# Patient Record
Sex: Female | Born: 1960 | Race: White | Hispanic: No | Smoking: Former smoker
Health system: Southern US, Community
[De-identification: ages and names within clinical notes are randomized; demographics above are authoritative.]

## PROBLEM LIST (undated history)

## (undated) DIAGNOSIS — I219 Acute myocardial infarction, unspecified: Secondary | ICD-10-CM

## (undated) DIAGNOSIS — J449 Chronic obstructive pulmonary disease, unspecified: Secondary | ICD-10-CM

## (undated) DIAGNOSIS — I10 Essential (primary) hypertension: Secondary | ICD-10-CM

## (undated) DIAGNOSIS — I2510 Atherosclerotic heart disease of native coronary artery without angina pectoris: Secondary | ICD-10-CM

## (undated) HISTORY — PX: APPENDECTOMY: SHX54

## (undated) HISTORY — PX: TUBAL LIGATION: SHX77

---

## 2007-03-17 ENCOUNTER — Other Ambulatory Visit: Payer: Self-pay

## 2007-03-17 ENCOUNTER — Inpatient Hospital Stay: Payer: Self-pay | Admitting: Internal Medicine

## 2010-12-20 ENCOUNTER — Observation Stay: Payer: Self-pay | Admitting: Internal Medicine

## 2011-01-25 ENCOUNTER — Ambulatory Visit: Payer: Self-pay

## 2012-08-02 DIAGNOSIS — J449 Chronic obstructive pulmonary disease, unspecified: Secondary | ICD-10-CM | POA: Insufficient documentation

## 2012-08-02 DIAGNOSIS — I2510 Atherosclerotic heart disease of native coronary artery without angina pectoris: Secondary | ICD-10-CM | POA: Insufficient documentation

## 2013-03-17 DIAGNOSIS — F338 Other recurrent depressive disorders: Secondary | ICD-10-CM | POA: Insufficient documentation

## 2013-03-17 DIAGNOSIS — E785 Hyperlipidemia, unspecified: Secondary | ICD-10-CM | POA: Insufficient documentation

## 2014-12-06 DIAGNOSIS — M79604 Pain in right leg: Secondary | ICD-10-CM | POA: Insufficient documentation

## 2014-12-29 DIAGNOSIS — G4700 Insomnia, unspecified: Secondary | ICD-10-CM | POA: Insufficient documentation

## 2014-12-29 DIAGNOSIS — F32A Depression, unspecified: Secondary | ICD-10-CM | POA: Insufficient documentation

## 2015-01-10 DIAGNOSIS — G2581 Restless legs syndrome: Secondary | ICD-10-CM | POA: Insufficient documentation

## 2015-01-10 DIAGNOSIS — G4739 Other sleep apnea: Secondary | ICD-10-CM | POA: Insufficient documentation

## 2015-02-25 DIAGNOSIS — N951 Menopausal and female climacteric states: Secondary | ICD-10-CM | POA: Insufficient documentation

## 2017-07-25 DIAGNOSIS — Z0000 Encounter for general adult medical examination without abnormal findings: Secondary | ICD-10-CM | POA: Insufficient documentation

## 2017-07-25 DIAGNOSIS — G629 Polyneuropathy, unspecified: Secondary | ICD-10-CM | POA: Insufficient documentation

## 2017-07-25 DIAGNOSIS — R5383 Other fatigue: Secondary | ICD-10-CM | POA: Insufficient documentation

## 2018-03-21 DIAGNOSIS — L989 Disorder of the skin and subcutaneous tissue, unspecified: Secondary | ICD-10-CM | POA: Insufficient documentation

## 2018-08-26 DIAGNOSIS — E538 Deficiency of other specified B group vitamins: Secondary | ICD-10-CM | POA: Insufficient documentation

## 2018-08-31 ENCOUNTER — Inpatient Hospital Stay
Admission: EM | Admit: 2018-08-31 | Discharge: 2018-09-02 | DRG: 419 | Disposition: A | Discharge status: Home / Self Care | Payer: Medicaid Other | Attending: Internal Medicine | Admitting: Internal Medicine

## 2018-08-31 ENCOUNTER — Inpatient Hospital Stay: Payer: Medicaid Other

## 2018-08-31 ENCOUNTER — Emergency Department: Payer: Medicaid Other

## 2018-08-31 ENCOUNTER — Other Ambulatory Visit: Payer: Self-pay

## 2018-08-31 ENCOUNTER — Encounter: Payer: Self-pay | Admitting: Emergency Medicine

## 2018-08-31 DIAGNOSIS — J449 Chronic obstructive pulmonary disease, unspecified: Secondary | ICD-10-CM | POA: Diagnosis present

## 2018-08-31 DIAGNOSIS — Z972 Presence of dental prosthetic device (complete) (partial): Secondary | ICD-10-CM | POA: Diagnosis not present

## 2018-08-31 DIAGNOSIS — I252 Old myocardial infarction: Secondary | ICD-10-CM

## 2018-08-31 DIAGNOSIS — R1013 Epigastric pain: Secondary | ICD-10-CM | POA: Diagnosis present

## 2018-08-31 DIAGNOSIS — F329 Major depressive disorder, single episode, unspecified: Secondary | ICD-10-CM | POA: Diagnosis present

## 2018-08-31 DIAGNOSIS — K760 Fatty (change of) liver, not elsewhere classified: Secondary | ICD-10-CM | POA: Diagnosis present

## 2018-08-31 DIAGNOSIS — F419 Anxiety disorder, unspecified: Secondary | ICD-10-CM | POA: Diagnosis present

## 2018-08-31 DIAGNOSIS — R109 Unspecified abdominal pain: Secondary | ICD-10-CM | POA: Diagnosis present

## 2018-08-31 DIAGNOSIS — I10 Essential (primary) hypertension: Secondary | ICD-10-CM | POA: Diagnosis present

## 2018-08-31 DIAGNOSIS — F17210 Nicotine dependence, cigarettes, uncomplicated: Secondary | ICD-10-CM | POA: Diagnosis present

## 2018-08-31 DIAGNOSIS — Z716 Tobacco abuse counseling: Secondary | ICD-10-CM

## 2018-08-31 DIAGNOSIS — R7989 Other specified abnormal findings of blood chemistry: Secondary | ICD-10-CM | POA: Diagnosis present

## 2018-08-31 DIAGNOSIS — K8010 Calculus of gallbladder with chronic cholecystitis without obstruction: Principal | ICD-10-CM | POA: Diagnosis present

## 2018-08-31 DIAGNOSIS — R933 Abnormal findings on diagnostic imaging of other parts of digestive tract: Secondary | ICD-10-CM

## 2018-08-31 DIAGNOSIS — Z79899 Other long term (current) drug therapy: Secondary | ICD-10-CM | POA: Diagnosis not present

## 2018-08-31 DIAGNOSIS — Z885 Allergy status to narcotic agent status: Secondary | ICD-10-CM

## 2018-08-31 DIAGNOSIS — R945 Abnormal results of liver function studies: Secondary | ICD-10-CM

## 2018-08-31 DIAGNOSIS — I2510 Atherosclerotic heart disease of native coronary artery without angina pectoris: Secondary | ICD-10-CM | POA: Diagnosis present

## 2018-08-31 HISTORY — DX: Atherosclerotic heart disease of native coronary artery without angina pectoris: I25.10

## 2018-08-31 HISTORY — DX: Essential (primary) hypertension: I10

## 2018-08-31 HISTORY — DX: Acute myocardial infarction, unspecified: I21.9

## 2018-08-31 LAB — URINALYSIS, COMPLETE (UACMP) WITH MICROSCOPIC
Bilirubin Urine: NEGATIVE
Glucose, UA: NEGATIVE mg/dL
Hgb urine dipstick: NEGATIVE
Ketones, ur: NEGATIVE mg/dL
Leukocytes, UA: NEGATIVE
Nitrite: NEGATIVE
PROTEIN: 30 mg/dL — AB
Specific Gravity, Urine: 1.023 (ref 1.005–1.030)
pH: 5 (ref 5.0–8.0)

## 2018-08-31 LAB — COMPREHENSIVE METABOLIC PANEL
ALBUMIN: 4.2 g/dL (ref 3.5–5.0)
ALT: 672 U/L — AB (ref 0–44)
AST: 444 U/L — ABNORMAL HIGH (ref 15–41)
Alkaline Phosphatase: 403 U/L — ABNORMAL HIGH (ref 38–126)
Anion gap: 12 (ref 5–15)
BUN: 9 mg/dL (ref 6–20)
CO2: 21 mmol/L — ABNORMAL LOW (ref 22–32)
Calcium: 9.1 mg/dL (ref 8.9–10.3)
Chloride: 104 mmol/L (ref 98–111)
Creatinine, Ser: 0.75 mg/dL (ref 0.44–1.00)
GFR calc Af Amer: >60 mL/min (ref >60)
GFR calc non Af Amer: >60 mL/min (ref >60)
GLUCOSE: 153 mg/dL — AB (ref 70–99)
Potassium: 3.6 mmol/L (ref 3.5–5.1)
Sodium: 137 mmol/L (ref 135–145)
Total Bilirubin: 2.4 mg/dL — ABNORMAL HIGH (ref 0.3–1.2)
Total Protein: 8.2 g/dL — ABNORMAL HIGH (ref 6.5–8.1)

## 2018-08-31 LAB — CBC
HCT: 44.7 % (ref 36.0–46.0)
Hemoglobin: 14.7 g/dL (ref 12.0–15.0)
MCH: 33.3 pg (ref 26.0–34.0)
MCHC: 32.9 g/dL (ref 30.0–36.0)
MCV: 101.4 fL — ABNORMAL HIGH (ref 80.0–100.0)
Platelets: 275 10*3/uL (ref 150–400)
RBC: 4.41 MIL/uL (ref 3.87–5.11)
RDW: 13.6 % (ref 11.5–15.5)
WBC: 9.2 10*3/uL (ref 4.0–10.5)
nRBC: 0 % (ref 0.0–0.2)

## 2018-08-31 LAB — BILIRUBIN, DIRECT: Bilirubin, Direct: 1.4 mg/dL — ABNORMAL HIGH (ref 0.0–0.2)

## 2018-08-31 LAB — LIPASE, BLOOD: Lipase: 33 U/L (ref 11–51)

## 2018-08-31 LAB — LACTATE DEHYDROGENASE: LDH: 477 U/L — ABNORMAL HIGH (ref 98–192)

## 2018-08-31 LAB — POCT PREGNANCY, URINE: Preg Test, Ur: NEGATIVE

## 2018-08-31 MED ORDER — SODIUM CHLORIDE 0.45 % IV SOLN
INTRAVENOUS | Status: DC
  Administered 2018-08-31 – 2018-09-02 (×3): via INTRAVENOUS

## 2018-08-31 MED ORDER — VITAMIN B-12 1000 MCG PO TABS
1000.0000 ug | ORAL_TABLET | Freq: Every day | ORAL | Status: DC
  Administered 2018-09-01 – 2018-09-02 (×2): 1000 ug via ORAL
  Filled 2018-08-31 (×2): qty 1

## 2018-08-31 MED ORDER — MORPHINE SULFATE (PF) 4 MG/ML IV SOLN
4.0000 mg | Freq: Once | INTRAVENOUS | Status: AC
  Administered 2018-08-31: 4 mg via INTRAVENOUS
  Filled 2018-08-31: qty 1

## 2018-08-31 MED ORDER — OMEGA-3-ACID ETHYL ESTERS 1 G PO CAPS
1000.0000 mg | ORAL_CAPSULE | Freq: Two times a day (BID) | ORAL | Status: DC
  Administered 2018-08-31 – 2018-09-02 (×3): 1000 mg via ORAL
  Filled 2018-08-31 (×3): qty 1

## 2018-08-31 MED ORDER — ONDANSETRON HCL 4 MG/2ML IJ SOLN
4.0000 mg | Freq: Once | INTRAMUSCULAR | Status: AC
  Administered 2018-08-31: 4 mg via INTRAVENOUS
  Filled 2018-08-31: qty 2

## 2018-08-31 MED ORDER — CEFAZOLIN (ANCEF) 1 G IV SOLR: 1.0000 g | INTRAVENOUS | Status: DC

## 2018-08-31 MED ORDER — MORPHINE SULFATE (PF) 2 MG/ML IV SOLN
2.0000 mg | INTRAVENOUS | Status: DC | PRN
  Administered 2018-09-01 – 2018-09-02 (×3): 2 mg via INTRAVENOUS
  Filled 2018-08-31 (×3): qty 1

## 2018-08-31 MED ORDER — TRAZODONE HCL 50 MG PO TABS: 50.0000 mg | ORAL_TABLET | Freq: Every evening | ORAL | Status: DC | PRN

## 2018-08-31 MED ORDER — ONDANSETRON HCL 4 MG/2ML IJ SOLN: 4.0000 mg | Freq: Four times a day (QID) | INTRAMUSCULAR | Status: DC | PRN

## 2018-08-31 MED ORDER — LAMOTRIGINE 25 MG PO TABS
25.0000 mg | ORAL_TABLET | Freq: Two times a day (BID) | ORAL | Status: DC
  Administered 2018-08-31 – 2018-09-02 (×3): 25 mg via ORAL
  Filled 2018-08-31 (×3): qty 1

## 2018-08-31 MED ORDER — TIOTROPIUM BROMIDE MONOHYDRATE 18 MCG IN CAPS
1.0000 | ORAL_CAPSULE | Freq: Every day | RESPIRATORY_TRACT | Status: DC
  Administered 2018-09-01 – 2018-09-02 (×2): 18 ug via RESPIRATORY_TRACT
  Filled 2018-08-31: qty 5

## 2018-08-31 MED ORDER — ROPINIROLE HCL 1 MG PO TABS
1.5000 mg | ORAL_TABLET | Freq: Every day | ORAL | Status: DC
  Administered 2018-08-31 – 2018-09-01 (×2): 1.5 mg via ORAL
  Filled 2018-08-31 (×2): qty 2

## 2018-08-31 MED ORDER — METOPROLOL TARTRATE 25 MG PO TABS
25.0000 mg | ORAL_TABLET | Freq: Two times a day (BID) | ORAL | Status: DC
  Administered 2018-08-31 – 2018-09-02 (×4): 25 mg via ORAL
  Filled 2018-08-31 (×4): qty 1

## 2018-08-31 MED ORDER — SODIUM CHLORIDE 0.9 % IV BOLUS
1000.0000 mL | Freq: Once | INTRAVENOUS | Status: AC
  Administered 2018-08-31: 1000 mL via INTRAVENOUS

## 2018-08-31 MED ORDER — CEFAZOLIN SODIUM-DEXTROSE 1-4 GM/50ML-% IV SOLN
1.0000 g | Freq: Once | INTRAVENOUS | Status: AC
  Administered 2018-09-01: 1 g via INTRAVENOUS
  Filled 2018-08-31 (×2): qty 50

## 2018-08-31 MED ORDER — HYDRALAZINE HCL 20 MG/ML IJ SOLN: 10.0000 mg | INTRAMUSCULAR | Status: DC | PRN

## 2018-08-31 MED ORDER — CLONAZEPAM 0.5 MG PO TABS: 0.5000 mg | ORAL_TABLET | Freq: Every day | ORAL | Status: DC

## 2018-08-31 MED ORDER — NICOTINE 14 MG/24HR TD PT24
14.0000 mg | MEDICATED_PATCH | Freq: Every day | TRANSDERMAL | Status: DC
  Administered 2018-08-31 – 2018-09-02 (×3): 14 mg via TRANSDERMAL
  Filled 2018-08-31 (×3): qty 1

## 2018-08-31 MED ORDER — MUPIROCIN 2 % EX OINT
1.0000 "application " | TOPICAL_OINTMENT | Freq: Two times a day (BID) | CUTANEOUS | Status: DC
  Filled 2018-08-31: qty 22

## 2018-08-31 MED ORDER — GADOBUTROL 1 MMOL/ML IV SOLN
6.0000 mL | Freq: Once | INTRAVENOUS | Status: AC | PRN
  Administered 2018-08-31: 6 mL via INTRAVENOUS

## 2018-08-31 MED ORDER — AMLODIPINE BESYLATE 5 MG PO TABS
5.0000 mg | ORAL_TABLET | Freq: Every day | ORAL | Status: DC
  Administered 2018-09-01 – 2018-09-02 (×2): 5 mg via ORAL
  Filled 2018-08-31 (×2): qty 1

## 2018-08-31 MED ORDER — ENOXAPARIN SODIUM 40 MG/0.4ML ~~LOC~~ SOLN
40.0000 mg | SUBCUTANEOUS | Status: DC
  Filled 2018-08-31: qty 0.4

## 2018-08-31 MED ORDER — IOPAMIDOL (ISOVUE-300) INJECTION 61%
100.0000 mL | Freq: Once | INTRAVENOUS | Status: AC | PRN
  Administered 2018-08-31: 100 mL via INTRAVENOUS

## 2018-08-31 MED ORDER — VENLAFAXINE HCL ER 75 MG PO CP24
75.0000 mg | ORAL_CAPSULE | Freq: Every day | ORAL | Status: DC
  Administered 2018-08-31 – 2018-09-01 (×2): 75 mg via ORAL
  Filled 2018-08-31 (×2): qty 1

## 2018-08-31 MED ORDER — LISINOPRIL 20 MG PO TABS
20.0000 mg | ORAL_TABLET | Freq: Every day | ORAL | Status: DC
  Administered 2018-09-01 – 2018-09-02 (×2): 20 mg via ORAL
  Filled 2018-08-31 (×2): qty 1

## 2018-08-31 MED ORDER — CLONAZEPAM 0.5 MG PO TABS
0.5000 mg | ORAL_TABLET | Freq: Every day | ORAL | Status: DC
  Administered 2018-08-31 – 2018-09-01 (×2): 1 mg via ORAL
  Filled 2018-08-31 (×2): qty 2

## 2018-08-31 MED ORDER — VENLAFAXINE HCL ER 75 MG PO CP24
150.0000 mg | ORAL_CAPSULE | Freq: Every day | ORAL | Status: DC
  Administered 2018-09-01 – 2018-09-02 (×2): 150 mg via ORAL
  Filled 2018-08-31 (×2): qty 2

## 2018-08-31 MED ORDER — ONDANSETRON HCL 4 MG PO TABS: 4.0000 mg | ORAL_TABLET | Freq: Four times a day (QID) | ORAL | Status: DC | PRN

## 2018-08-31 MED ORDER — ROPINIROLE HCL 0.25 MG PO TABS
0.2500 mg | ORAL_TABLET | Freq: Three times a day (TID) | ORAL | Status: DC
  Filled 2018-08-31 (×2): qty 1

## 2018-08-31 NOTE — Consult Note (Signed)
Subjective:   CC: adenomyomatosis  HPI:  Danielle Briggs is a 57 y.o. female who is consulted by [facility] for evaluation of above cc.  Symptoms were first noted a few days ago. Pain is sharp, constricting, wrapping around entire lower rib cage.  Associated with nausea/vomting, exacerbated by nothing specific.  First episode was a few days ago and not as severe, but this episode was worse so came to ED.  Workup revealed elevated LFTs, normal lipase, but negative imaging studies for choledocholithiasis and acute cholecystitis.  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    Patient currently is feeling better but still having some residual pain with no appetite.  Tolerating clears.     Past Medical History:  has a past medical history of Coronary artery disease, Hypertension, and MI (myocardial infarction) (HCC).  Past Surgical History:  has a past surgical history that includes Appendectomy and Tubal ligation.  Family History: reviewed and not relevant to CC  Social History:  reports that she has been smoking. She has never used smokeless tobacco. She reports that she drinks alcohol. She reports that she has current or past drug history. Drug: Marijuana.  Current Medications:  Medications Prior to Admission  Medication Sig Dispense Refill  . amLODipine (NORVASC) 5 MG tablet Take 5 mg by mouth daily. for high blood pressure  5  . clonazePAM (KLONOPIN) 1 MG tablet Take 0.5-1 mg by mouth 2 (two) times daily as needed.   0  . CVS VITAMIN B12 1000 MCG tablet Take 1,000 mcg by mouth daily.  11  . lamoTRIgine (LAMICTAL) 25 MG tablet Take 25 mg by mouth 2 (two) times daily.  0  . lisinopril (PRINIVIL,ZESTRIL) 20 MG tablet Take 20 mg by mouth daily. for high blood pressure  5  . metoprolol tartrate (LOPRESSOR) 25 MG tablet Take 25 mg by mouth 2 (two) times daily.  5  . Omega-3 Fatty Acids (ODORLESS COATED FISH OIL) 1000 MG CPDR Take 1,000 mg by mouth 2 (two) times daily.  5  . rOPINIRole  (REQUIP) 0.25 MG tablet Take 1.5 mg by mouth at bedtime.   5  . SPIRIVA HANDIHALER 18 MCG inhalation capsule Take 1 capsule by mouth daily.  5  . venlafaxine XR (EFFEXOR-XR) 75 MG 24 hr capsule Take 75-150 mg by mouth. Take 2 capsules (150mg ) in the morning, and 1 capsule (75mg ) in the evening  5    Allergies:  Allergies as of 08/31/2018 - Review Complete 08/31/2018  Allergen Reaction Noted  . Oxycodone Nausea Only 08/31/2018    ROS:  General: Denies weight loss, weight gain, fatigue, fevers, chills, and night sweats. Eyes: Denies blurry vision, double vision, eye pain, itchy eyes, and tearing. Ears: Denies hearing loss, earache, and ringing in ears. Nose: Denies sinus pain, congestion, infections, runny nose, and nosebleeds. Mouth/throat: Denies hoarseness, sore throat, bleeding gums, and difficulty swallowing. Heart: Denies chest pain, palpitations, racing heart, irregular heartbeat, leg pain or swelling, and decreased activity tolerance. Respiratory: Denies breathing difficulty, shortness of breath, wheezing, cough, and sputum. GI: Positive change in appetite, nausea, vomiting,  GU: Denies difficulty urinating, pain with urinating, urgency, frequency, blood in urine, and heavy menstrual bleeding. Musculoskeletal: Denies joint stiffness, pain, swelling, muscle weakness, and pain. Skin: Denies rash, itching, mass, tumors, sores, and boils Neurologic: Denies headache, fainting, dizziness, seizures, numbness, and tingling. Psychiatric: Denies depression, anxiety, difficulty sleeping, and memory loss. Endocrine: Denies heat or cold intolerance, and increased thirst or urination. Blood/lymph: Denies easy bruising, easy  bruising, and swollen glands  pertinent positives and negatives noted in HPI    Objective:     BP (!) 159/92 (BP Location: Right Arm)   Pulse 64   Temp (!) 97.4 F (36.3 C) (Oral)   Resp 20   Ht 4\' 9"  (1.448 m)   Wt 63.5 kg   SpO2 96%   BMI 30.30 kg/m     Constitutional :  alert, cooperative, appears stated age and no distress  Lymphatics/Throat:  no asymmetry, masses, or scars  Respiratory:  clear to auscultation bilaterally  Cardiovascular:  regular rate and rhythm  Gastrointestinal: soft, non-tender; bowel sounds normal; no masses,  no organomegaly.   Musculoskeletal: Steady gait and movement  Skin: Cool and moist  Psychiatric: Normal affect, non-agitated, not confused       LABS:  CMP Latest Ref Rng & Units 08/31/2018  Glucose 70 - 99 mg/dL 161(W)  BUN 6 - 20 mg/dL 9  Creatinine 9.60 - 4.54 mg/dL 0.98  Sodium 119 - 147 mmol/L 137  Potassium 3.5 - 5.1 mmol/L 3.6  Chloride 98 - 111 mmol/L 104  CO2 22 - 32 mmol/L 21(L)  Calcium 8.9 - 10.3 mg/dL 9.1  Total Protein 6.5 - 8.1 g/dL 8.2(H)  Total Bilirubin 0.3 - 1.2 mg/dL 2.4(H)  Alkaline Phos 38 - 126 U/L 403(H)  AST 15 - 41 U/L 444(H)  ALT 0 - 44 U/L 672(H)   CBC Latest Ref Rng & Units 08/31/2018  WBC 4.0 - 10.5 K/uL 9.2  Hemoglobin 12.0 - 15.0 g/dL 82.9  Hematocrit 56.2 - 46.0 % 44.7  Platelets 150 - 400 K/uL 275     RADS: CLINICAL DATA:  Epigastric pain for 3 days.  EXAM: ULTRASOUND ABDOMEN LIMITED RIGHT UPPER QUADRANT  COMPARISON:  None.  FINDINGS: Gallbladder:  The gallbladder is incompletely distended on this exam which limits evaluation. Multiple calcified gallstones are noted. Gallbladder wall thickening is likely due to underdistention. No sonographic Murphy sign noted by sonographer.  Common bile duct:  Diameter: 3 mm, within normal limits.  Liver:  No focal lesion identified. Within normal limits in parenchymal echogenicity. Portal vein is patent on color Doppler imaging with normal direction of blood flow towards the liver.  IMPRESSION: Suboptimal visualization of gallbladder due to underdistention. Cholelithiasis noted, but no definite definite signs of acute cholecystitis or biliary ductal dilatation.   Electronically Signed    By: Myles Rosenthal M.D.   On: 08/31/2018 13:08  EXAM: CT ABDOMEN AND PELVIS WITH CONTRAST  TECHNIQUE: Multidetector CT imaging of the abdomen and pelvis was performed using the standard protocol following bolus administration of intravenous contrast.  CONTRAST:  ISOVUE-300 IOPAMIDOL (ISOVUE-300) INJECTION 61%  COMPARISON:  None.  FINDINGS: Lower Chest: No acute findings.  Hepatobiliary: No hepatic masses identified. Mild-to-moderate diffuse hepatic steatosis.  Gallbladder is nearly completely empty. Diffuse mucosal enhancement is seen within the gallbladder, without significant wall thickening or pericholecystic inflammatory changes. This finding is nonspecific and differential includes chronic cholecystitis, adenomyosis, hepatocellular disease, and hypoalbuminemia, as well as other causes. No evidence of biliary ductal dilatation.  Pancreas: No mass or inflammatory changes. No evidence of pancreatic ductal dilatation.  Spleen: Within normal limits in size and appearance.  Adrenals/Urinary Tract: Diffuse left renal parenchymal atrophy and scarring. No masses identified. No evidence of hydronephrosis. Unremarkable unopacified urinary bladder.  Stomach/Bowel: No evidence of obstruction, inflammatory process or abnormal fluid collections.  Vascular/Lymphatic: No pathologically enlarged lymph nodes. No abdominal aortic aneurysm. Aortic atherosclerosis.  Reproductive:  No mass  or other significant abnormality.  Other:  None.  Musculoskeletal:  No suspicious bone lesions identified.  IMPRESSION: Nearly empty gallbladder with diffuse mucosal enhancement. This finding is nonspecific, with differential diagnosis including chronic cholecystitis, adenomyosis, hepatocellular disease, and hypoalbuminemia.  Mild-to-moderate hepatic steatosis.   Electronically Signed   By: Myles Rosenthal M.D.   On: 08/31/2018 14:28  CLINICAL DATA:  Gallstones with  elevated liver function tests.  EXAM: MRI ABDOMEN WITHOUT AND WITH CONTRAST (INCLUDING MRCP)  TECHNIQUE: Multiplanar multisequence MR imaging of the abdomen was performed both before and after the administration of intravenous contrast. Heavily T2-weighted images of the biliary and pancreatic ducts were obtained, and three-dimensional MRCP images were rendered by post processing.  CONTRAST:  6 cc Gadavist.  COMPARISON:  CT scan from earlier the same day.  FINDINGS: Lower chest: Unremarkable.  Hepatobiliary: No focal abnormality within the liver parenchyma. The gallbladder is contracted with diffuse gallbladder wall thickening. T2 imaging shows multiple fluid-filled cysts in the wall of the gallbladder creating a "string of pearls" sign, characteristic for adenomyomatosis (see coronal T2 image 17 of series 12 and axial image 19 of series 4). No intra or extrahepatic biliary duct dilatation. Common duct measures 6 mm diameter. Common bile duct in the head of the pancreas measures 5 mm diameter.  Pancreas: No focal mass lesion. No dilatation of the main duct. No intraparenchymal cyst. No peripancreatic edema.  Spleen:  No splenomegaly. No focal mass lesion.  Adrenals/Urinary Tract: No adrenal nodule or mass. Right kidney unremarkable. Left kidney is atrophic.  Stomach/Bowel: Stomach is nondistended. No gastric wall thickening. No evidence of outlet obstruction. Duodenum is normally positioned as is the ligament of Treitz. No small bowel or colonic dilatation within the visualized abdomen.  Vascular/Lymphatic: No abdominal aortic aneurysm. No abdominal aortic atherosclerotic calcification. There is no gastrohepatic or hepatoduodenal ligament lymphadenopathy. No intraperitoneal or retroperitoneal lymphadenopathy.  Other:  No intraperitoneal free fluid.  Musculoskeletal: No abnormal marrow enhancement within the visualized bony anatomy.  IMPRESSION: 1.  Gallbladder is decompressed with diffuse gallbladder wall thickening. Mural hyperenhancement and the presence of numerous intramural cystic foci is characteristic for diffuse adenomyomatosis. No definite stones are evident although assessment limited by the contracted state of the gallbladder. No intrahepatic biliary duct dilatation . Extrahepatic bile duct upper normal for size. No evidence for choledocholithiasis   Electronically Signed   By: Kennith Center M.D.   On: 08/31/2018 19:03  Assessment:      Adenomyomatosis of GB Elevated LFTs   Plan:      Discussed the risk of surgery including post-op infxn, seroma, biloma, chronic pain, poor-delayed wound healing, retained gallstone, conversion to open procedure, post-op SBO or ileus, and need for additional procedures to address said risks.  The risks of general anesthetic including MI, CVA, sudden death or even reaction to anesthetic medications also discussed. Alternatives include continued observation.  Benefits include possible symptom relief, prevention of complications including acute cholecystitis, pancreatitis.  Typical post operative recovery of 3-5 days rest, continued pain in area and incision sites, possible loose stools up to 4-6 weeks, also discussed.  I explained to her pathophysiology of adenomyomatosis of the gallbladder.  Specifically said that these polyps themselves are not a precursor to cancer.  Also explained that the MRI has a fairly high specificity for said disease.  The description of her pain especially the bilateral nature of it is very atypical presentation for adenomyomatosis.  The elevated LFTs likely are result of a choledocholithiasis that has already resolved  on its own by the time MRCP was done.  Choledocholithiasis also does not typically present with bilateral wrapping pain starting in the back.  After explaining all this to her I reemphasized that removing her gallbladder does not guarantee that  her initial symptoms will resolve, and there is technically no oncological benefit of removing the adenomyomatosis.  I did state that he will likely resolve possible recurrences of choledocholithiasis.  Which is still likely the main cause of her elevated LFTs.  The patient understands the risks, any and all questions were answered to the patient's satisfaction.  She stated that she does not want to live with "polyps" for the rest of her life and if the pain does recur we can at least say that it is not the gallbladder that is causing the pain.  Therefore she is agreeable to proceeding with lap chole.  We will schedule as an add-on for tomorrow hopefully.  N.p.o. past midnight, sips with meds ok.  Repeat LFTs tomorrow morning to ensure that they are downtrending.  Nurse notified of plan as well.

## 2018-08-31 NOTE — Progress Notes (Signed)
Notified by Dr. Katheren ShamsSalary via  Epic text of consult.  Recommended MRCP prior to any surgical intervention.  Will continue to follow, formal consult note to follow

## 2018-08-31 NOTE — ED Triage Notes (Signed)
Pt to ED via POV c/o upper abdominal pain x 2-3 days. Pt states that the pain extends all the way around her abdomen and into both sides of her back. Night before last she had an episode of vomiting. Pt states that it is hard for her to get comfortable due to the pain. Pt is in NAD at this time.

## 2018-08-31 NOTE — Progress Notes (Signed)
Family Meeting Note  Advance Directive:yes  Today a meeting took place with the Patient.  Patient is able to participate   The following clinical team members were present during this meeting:MD  The following were discussed:Patient's diagnosis:abd pain , Patient's progosis: Unable to determine and Goals for treatment: Full Code  Additional follow-up to be provided: prn  Time spent during discussion:20 minutes  Hesham Womac D Krystale Rinkenberger, MD  

## 2018-08-31 NOTE — ED Notes (Signed)
MRI called to speak with pt - pt given ED phone

## 2018-08-31 NOTE — ED Provider Notes (Signed)
[facility] Emergency Department Provider Note ____________________________________________   First MD Initiated Contact with Patient 08/31/18 1151     (approximate)  I have reviewed the triage vital signs and the nursing notes.   HISTORY  Chief Complaint Abdominal Pain    HPI Danielle Briggs is a 57 y.o. female with PMH as noted below who presents with epigastric abdominal pain, radiating around to her back, and associated with nausea and vomiting this morning.  The patient states the pain started 3 days ago, got significantly worse 2 days ago, but then improved somewhat yesterday.  She has kept her up since last night.  She denies any prior history of the pain.  The patient does report drinking alcohol but says she does not do so heavily and has not had any significant alcohol intake in the last few days.  Past Medical History:  Diagnosis Date  . Coronary artery disease   . Hypertension   . MI (myocardial infarction) [facility])     Patient Active Problem List   Diagnosis Date Noted  . Abdominal pain 08/31/2018    Past Surgical History:  Procedure Laterality Date  . APPENDECTOMY    . TUBAL LIGATION      Prior to Admission medications   Medication Sig Start Date End Date Taking? Authorizing Provider  amLODipine (NORVASC) 5 MG tablet Take 5 mg by mouth daily. for high blood pressure 08/26/18  Yes [provider]  clonazePAM (KLONOPIN) 1 MG tablet Take 0.5-1 mg by mouth daily. 08/26/18  Yes [provider]  CVS VITAMIN B12 1000 MCG tablet Take 1,000 mcg by mouth daily. 08/26/18  Yes [provider]  lamoTRIgine (LAMICTAL) 25 MG tablet Take 25 mg by mouth 2 (two) times daily. 07/21/18  Yes [provider]  lisinopril (PRINIVIL,ZESTRIL) 20 MG tablet Take 20 mg by mouth daily. for high blood pressure 08/22/18  Yes [provider]  metoprolol tartrate (LOPRESSOR) 25 MG tablet Take 25 mg by mouth 2 (two) times  daily. 08/22/18  Yes [provider]  Omega-3 Fatty Acids (ODORLESS COATED FISH OIL) 1000 MG CPDR Take 1,000 mg by mouth 2 (two) times daily. 08/26/18  Yes [provider]  rOPINIRole (REQUIP) 0.25 MG tablet Take 0.25 mg by mouth 3 (three) times daily. 08/22/18  Yes [provider]  SPIRIVA HANDIHALER 18 MCG inhalation capsule Take 1 capsule by mouth daily. 06/28/18  Yes [provider]  venlafaxine XR (EFFEXOR-XR) 75 MG 24 hr capsule Take 75-150 mg by mouth. Take 2 capsules ('150mg'$ ) in the morning, and 1 capsule ('75mg'$ ) in the evening 08/17/18  Yes [provider]    Allergies Oxycodone  No family history on file.  Social History Social History   Tobacco Use  . Smoking status: Current Every Day Smoker  . Smokeless tobacco: Never Used  Substance Use Topics  . Alcohol use: Yes  . Drug use: Yes    Types: Marijuana    Review of Systems  Constitutional: No fever. Eyes: No redness. ENT: No sore throat. Cardiovascular: Denies chest pain. Respiratory: Denies shortness of breath. Gastrointestinal: Positive for nausea and vomiting.  No diarrhea. Genitourinary: Negative for dysuria.  Musculoskeletal: Negative for back pain. Skin: Negative for rash. Neurological: Negative for headache.   ____________________________________________   PHYSICAL EXAM:  VITAL SIGNS: ED Triage Vitals  Enc Vitals Group     BP 08/31/18 0922 (!) 174/100     Pulse Rate 08/31/18 0922 62     Resp 08/31/18  0922 16     Temp 08/31/18 0922 98.1 F (36.7 C)     Temp Source 08/31/18 0922 Oral     SpO2 08/31/18 0922 97 %     Weight 08/31/18 0924 140 lb (63.5 kg)     Height 08/31/18 0924 '4\' 9"'$  (1.448 m)     Head Circumference --      Peak Flow --      Pain Score 08/31/18 0923 9     Pain Loc --      Pain Edu? --      Excl. in [place]? --     Constitutional: Alert and oriented.  Uncomfortable appearing but in no acute distress. Eyes: Conjunctivae are normal.  No  scleral icterus. Head: Atraumatic. Nose: No congestion/rhinnorhea. Mouth/Throat: Mucous membranes are slightly dry.   Neck: Normal range of motion.  Cardiovascular:  Good peripheral circulation. Respiratory: Normal respiratory effort.  No retractions.  Gastrointestinal: Soft with epigastric tenderness.  No distention.  Genitourinary: No flank tenderness. Musculoskeletal: Extremities warm and well perfused.  Neurologic:  Normal speech and language. No gross focal neurologic deficits are appreciated.  Skin:  Skin is warm and dry. No rash noted. Psychiatric: Mood and affect are normal. Speech and behavior are normal.  ____________________________________________   LABS (all labs ordered are listed, but only abnormal results are displayed)  Labs Reviewed  COMPREHENSIVE METABOLIC PANEL - Abnormal; Notable for the following components:      Result Value   CO2 21 (*)    Glucose, Bld 153 (*)    Total Protein 8.2 (*)    AST 444 (*)    ALT 672 (*)    Alkaline Phosphatase 403 (*)    Total Bilirubin 2.4 (*)    All other components within normal limits  CBC - Abnormal; Notable for the following components:   MCV 101.4 (*)    All other components within normal limits  URINALYSIS, COMPLETE (UACMP) WITH MICROSCOPIC - Abnormal; Notable for the following components:   Color, Urine AMBER (*)    APPearance CLEAR (*)    Protein, ur 30 (*)    Bacteria, UA RARE (*)    All other components within normal limits  BILIRUBIN, DIRECT - Abnormal; Notable for the following components:   Bilirubin, Direct 1.4 (*)    All other components within normal limits  LACTATE DEHYDROGENASE - Abnormal; Notable for the following components:   LDH 477 (*)    All other components within normal limits  LIPASE, BLOOD  HEPATITIS PANEL, ACUTE  POC URINE PREG, ED  POCT PREGNANCY, URINE   ____________________________________________  EKG  ED ECG REPORT I, Arta Silence, the attending physician, personally  viewed and interpreted this ECG.  Date: 08/31/2018 EKG Time: 929 Rate: 58 Rhythm: normal sinus rhythm QRS Axis: normal Intervals: normal ST/T Wave abnormalities: Nonspecific anterior ST abnormalities Narrative Interpretation: no evidence of acute ischemia; no significant change when compared to EKG of 03/17/2007   ____________________________________________  RADIOLOGY  [place] RUQ: Collapsed gallbladder with no evidence of acute cholecystitis CT abdomen: Contracted gallbladder with mucosal enhancement  ____________________________________________   PROCEDURES  Procedure(s) performed: No  Procedures  Critical Care performed: No ____________________________________________   INITIAL IMPRESSION / ASSESSMENT AND PLAN / ED COURSE  Pertinent labs & imaging results that were available during my care of the patient were reviewed by me and considered in my medical decision making (see chart for details).  57 year old female with past history as noted above presents with epigastric pain radiating around from  her back associated with nausea and vomiting over the last few days.  No prior history of this pain.  On exam, the patient is hypertensive but afebrile and her other vital signs are normal.  She has some tenderness in the epigastric region.  She is uncomfortable but not acutely ill-appearing.  Initial labs obtained from triage reveal elevated LFTs and alk phos.  Differential includes pancreatitis, acute hepatitis, or cholecystitis or other hepatobiliary disease.  We will obtain additional labs including a lipase, right upper quadrant ultrasound, give analgesia, and reassess.  ----------------------------------------- 3:57 PM on 08/31/2018 -----------------------------------------  The patient's work-up has been somewhat ambiguous.  Her LDH and direct bilirubin are elevated, but her lipase is normal.  Ultrasound showed nonspecific findings, so I ordered a CT.  The CT shows mucosal  changes and differential includes chronic cholecystitis and adenomyosis among other possible etiologies.  Given the patient's elevated LFTs and bilirubins and the fact that she is continued to have some pain, she will require admission for further work-up.  She agrees with this plan.  I discussed the case with the hospitalist Dr. Jerelyn Charles.  ____________________________________________   FINAL CLINICAL IMPRESSION(S) / ED DIAGNOSES  Final diagnoses:  Epigastric pain  Elevated LFTs      NEW MEDICATIONS STARTED DURING THIS VISIT:  New Prescriptions   No medications on file     Note:  This document was prepared using Dragon voice recognition software and may include unintentional dictation errors.    Arta Silence, MD 08/31/18 1558

## 2018-08-31 NOTE — ED Notes (Signed)
ED TO INPATIENT HANDOFF REPORT  Name/Age/Gender Danielle Briggs 57 y.o. female  Code Status   Home/SNF/Other Home  Chief Complaint pain from both sides front to back  Level of Care/Admitting Diagnosis ED Disposition    ED Disposition Condition Comment   Admit  Hospital Area: [facility] [[id number]]  Level of Care: Med-Surg [16]  Diagnosis: Abdominal pain [161096]  Admitting Physician: Bertrum Sol [[id number]]  Attending Physician: Bertrum Sol [[id number]]  Estimated length of stay: past midnight tomorrow  Certification:: I certify this patient will need inpatient services for at least 2 midnights  PT Class (Do Not Modify): Inpatient [101]  PT Acc Code (Do Not Modify): Private [1]       Medical History Past Medical History:  Diagnosis Date  . Coronary artery disease   . Hypertension   . MI (myocardial infarction) (HCC)     Allergies Allergies  Allergen Reactions  . Oxycodone Nausea Only    IV Location/Drains/Wounds Patient Lines/Drains/Airways Status   Active Line/Drains/Airways    Name:   Placement date:   Placement time:   Site:   Days:   Peripheral IV 08/31/18 Right Hand   08/31/18    1213    Hand   less than 1          Labs/Imaging Results for orders placed or performed during the hospital encounter of 08/31/18 (from the past 48 hour(s))  Lipase, blood     Status: None   Collection Time: 08/31/18  9:29 AM  Result Value Ref Range   Lipase 33 11 - 51 U/L    Comment: Performed at [facility], [address] [place], [place] [address]  Comprehensive metabolic panel     Status: Abnormal   Collection Time: 08/31/18  9:29 AM  Result Value Ref Range   Sodium 137 135 - 145 mmol/L   Potassium 3.6 3.5 - 5.1 mmol/L   Chloride 104 98 - 111 mmol/L   CO2 21 (L) 22 - 32 mmol/L   Glucose, Bld 153 (H) 70 - 99 mg/dL   BUN 9 6 - 20 mg/dL   Creatinine, Ser 1.30 0.44 - 1.00 mg/dL   Calcium 9.1 8.9 - 86.5 mg/dL   Total  Protein 8.2 (H) 6.5 - 8.1 g/dL   Albumin 4.2 3.5 - 5.0 g/dL   AST 784 (H) 15 - 41 U/L   ALT 672 (H) 0 - 44 U/L   Alkaline Phosphatase 403 (H) 38 - 126 U/L   Total Bilirubin 2.4 (H) 0.3 - 1.2 mg/dL   GFR calc non Af Amer >60 >60 mL/min   GFR calc Af Amer >60 >60 mL/min   Anion gap 12 5 - 15    Comment: Performed at [facility], [address] [place], [place] [address]  CBC     Status: Abnormal   Collection Time: 08/31/18  9:29 AM  Result Value Ref Range   WBC 9.2 4.0 - 10.5 K/uL   RBC 4.41 3.87 - 5.11 MIL/uL   Hemoglobin 14.7 12.0 - 15.0 g/dL   HCT 52.8 41.3 - 24.4 %   MCV 101.4 (H) 80.0 - 100.0 fL   MCH 33.3 26.0 - 34.0 pg   MCHC 32.9 30.0 - 36.0 g/dL   RDW 01.0 27.2 - 53.6 %   Platelets 275 150 - 400 K/uL   nRBC 0.0 0.0 - 0.2 %    Comment: Performed at [facility], [address] [place], [place] [address]  Urinalysis, Complete w Microscopic     Status: Abnormal   Collection Time: 08/31/18  9:34 AM  Result Value Ref Range   Color, Urine AMBER (A) YELLOW    Comment: BIOCHEMICALS MAY BE AFFECTED BY COLOR   APPearance CLEAR (A) CLEAR   Specific Gravity, Urine 1.023 1.005 - 1.030   pH 5.0 5.0 - 8.0   Glucose, UA NEGATIVE NEGATIVE mg/dL   Hgb urine dipstick NEGATIVE NEGATIVE   Bilirubin Urine NEGATIVE NEGATIVE   Ketones, ur NEGATIVE NEGATIVE mg/dL   Protein, ur 30 (A) NEGATIVE mg/dL   Nitrite NEGATIVE NEGATIVE   Leukocytes, UA NEGATIVE NEGATIVE   RBC / HPF 0-5 0 - 5 RBC/hpf   WBC, UA 0-5 0 - 5 WBC/hpf   Bacteria, UA RARE (A) NONE SEEN   Squamous Epithelial / LPF 0-5 0 - 5   Mucus PRESENT    Ca Oxalate Crys, UA PRESENT     Comment: Performed at [facility], [address] [place], [place] [address]  Pregnancy, urine POC     Status: None   Collection Time: 08/31/18  9:38 AM  Result Value Ref Range   Preg Test, Ur NEGATIVE NEGATIVE    Comment:        THE SENSITIVITY OF THIS METHODOLOGY IS >24 mIU/mL   Bilirubin, direct     Status:  Abnormal   Collection Time: 08/31/18 12:22 PM  Result Value Ref Range   Bilirubin, Direct 1.4 (H) 0.0 - 0.2 mg/dL    Comment: Performed at [facility], [address] [place], [place] [address]  Lactate dehydrogenase     Status: Abnormal   Collection Time: 08/31/18 12:22 PM  Result Value Ref Range   LDH 477 (H) 98 - 192 U/L    Comment: Performed at [facility], [address] [place], [place] [address]   Ct Abdomen Pelvis W Contrast  Result Date: 08/31/2018 CLINICAL DATA:  Right upper quadrant pain for 3 days.  Vomiting. EXAM: CT ABDOMEN AND PELVIS WITH CONTRAST TECHNIQUE: Multidetector CT imaging of the abdomen and pelvis was performed using the standard protocol following bolus administration of intravenous contrast. CONTRAST:  ISOVUE-300 IOPAMIDOL (ISOVUE-300) INJECTION 61% COMPARISON:  None. FINDINGS: Lower Chest: No acute findings. Hepatobiliary: No hepatic masses identified. Mild-to-moderate diffuse hepatic steatosis. Gallbladder is nearly completely empty. Diffuse mucosal enhancement is seen within the gallbladder, without significant wall thickening or pericholecystic inflammatory changes. This finding is nonspecific and differential includes chronic cholecystitis, adenomyosis, hepatocellular disease, and hypoalbuminemia, as well as other causes. No evidence of biliary ductal dilatation. Pancreas: No mass or inflammatory changes. No evidence of pancreatic ductal dilatation. Spleen: Within normal limits in size and appearance. Adrenals/Urinary Tract: Diffuse left renal parenchymal atrophy and scarring. No masses identified. No evidence of hydronephrosis. Unremarkable unopacified urinary bladder. Stomach/Bowel: No evidence of obstruction, inflammatory process or abnormal fluid collections. Vascular/Lymphatic: No pathologically enlarged lymph nodes. No abdominal aortic aneurysm. Aortic atherosclerosis. Reproductive:  No mass or other significant abnormality. Other:   None. Musculoskeletal:  No suspicious bone lesions identified. IMPRESSION: Nearly empty gallbladder with diffuse mucosal enhancement. This finding is nonspecific, with differential diagnosis including chronic cholecystitis, adenomyosis, hepatocellular disease, and hypoalbuminemia. Mild-to-moderate hepatic steatosis. Electronically Signed   By: Myles Rosenthal M.D.   On: 08/31/2018 14:28   US Abdomen Limited Ruq  Result Date: 08/31/2018 CLINICAL DATA:  Epigastric pain for 3 days. EXAM: ULTRASOUND ABDOMEN LIMITED RIGHT UPPER QUADRANT COMPARISON:  None. FINDINGS: Gallbladder: The gallbladder is incompletely distended on this  exam which limits evaluation. Multiple calcified gallstones are noted. Gallbladder wall thickening is likely due to underdistention. No sonographic Murphy sign noted by sonographer. Common bile duct: Diameter: 3 mm, within normal limits. Liver: No focal lesion identified. Within normal limits in parenchymal echogenicity. Portal vein is patent on color Doppler imaging with normal direction of blood flow towards the liver. IMPRESSION: Suboptimal visualization of gallbladder due to underdistention. Cholelithiasis noted, but no definite definite signs of acute cholecystitis or biliary ductal dilatation. Electronically Signed   By: Myles RosenthalJohn  Stahl M.D.   On: 08/31/2018 13:08    Pending Labs Unresulted Labs (From admission, onward)    Start     Ordered   08/31/18 1546  Hepatitis panel, acute  Once,   STAT     08/31/18 1545   Signed and Held  HIV antibody (Routine Testing)  Once,   R     Signed and Held   Signed and Held  CBC  (enoxaparin (LOVENOX)    CrCl >/= 30 ml/min)  Once,   R    Comments:  Baseline for enoxaparin therapy IF NOT ALREADY DRAWN.  Notify MD if PLT < 100 K.    Signed and Held   Signed and Held  Creatinine, serum  (enoxaparin (LOVENOX)    CrCl >/= 30 ml/min)  Once,   R    Comments:  Baseline for enoxaparin therapy IF NOT ALREADY DRAWN.    Signed and Held   Signed and Held   Creatinine, serum  (enoxaparin (LOVENOX)    CrCl >/= 30 ml/min)  Weekly,   R    Comments:  while on enoxaparin therapy    Signed and Held   Signed and Held  Comprehensive metabolic panel  Tomorrow morning,   R     Signed and Held   Signed and Held  CBC  Tomorrow morning,   R     Signed and Held          Vitals/Pain Today's Vitals   08/31/18 1246 08/31/18 1330 08/31/18 1530 08/31/18 1548  BP: 132/83 129/84 122/68   Pulse: 64 64 76   Resp:   15   Temp:      TempSrc:      SpO2: 91% 95% 98%   Weight:      Height:      PainSc:    4     Isolation Precautions No active isolations  Medications Medications  ondansetron (ZOFRAN) injection 4 mg (4 mg Intravenous Given 08/31/18 1215)  morphine 4 MG/ML injection 4 mg (4 mg Intravenous Given 08/31/18 1215)  sodium chloride 0.9 % bolus 1,000 mL (0 mLs Intravenous Stopped 08/31/18 1351)  iopamidol (ISOVUE-300) 61 % injection 100 mL (100 mLs Intravenous Contrast Given 08/31/18 1403)    Mobility walks

## 2018-08-31 NOTE — H&P (Addendum)
[facility] [place] [facility]   PATIENT NAME: Danielle Briggs    MR#:  [id number]  DATE OF BIRTH:  1960-12-06  DATE OF ADMISSION:  08/31/2018  PRIMARY CARE PHYSICIAN: Patient, No Pcp Per   REQUESTING/REFERRING PHYSICIAN:   CHIEF COMPLAINT:   Chief Complaint  Patient presents with  . Abdominal Pain    HISTORY OF PRESENT ILLNESS: Danielle Briggs  is a 57 y.o. female with a known history per below presented to the emergency room with two 3-day history of epigastric pain radiating into her back, associated with nausea, emesis, worse with eating, decreased p.o. intake over the last several days, ER work-up noted for elevated liver function tests, ultrasound of the gallbladder noted for gallstones, UA negative, CT abdomen noted for hepatic steatosis/gallbladder with diffuse mural enhancement, patient evaluated in the emergency room, husband at the bedside, patient continues to have abdominal pain but would like something to eat, patient is now been here for acute abdominal pain most likely secondary to acute cholecystitis.  PAST MEDICAL HISTORY:   Past Medical History:  Diagnosis Date  . Coronary artery disease   . Hypertension   . MI (myocardial infarction) (HCC)     PAST SURGICAL HISTORY:  Past Surgical History:  Procedure Laterality Date  . APPENDECTOMY    . TUBAL LIGATION      SOCIAL HISTORY:  Social History   Tobacco Use  . Smoking status: Current Every Day Smoker  . Smokeless tobacco: Never Used  Substance Use Topics  . Alcohol use: Yes    FAMILY HISTORY: No family history on file.  DRUG ALLERGIES:  Allergies  Allergen Reactions  . Oxycodone Nausea Only    REVIEW OF SYSTEMS:   CONSTITUTIONAL: No fever, fatigue or weakness.  EYES: No blurred or double vision.  EARS, NOSE, AND THROAT: No tinnitus or ear pain.  RESPIRATORY: No cough, shortness of breath, wheezing or hemoptysis.  CARDIOVASCULAR: No chest pain, orthopnea, edema.   GASTROINTESTINAL: + nausea, vomiting, abdominal pain.  GENITOURINARY: No dysuria, hematuria.  ENDOCRINE: No polyuria, nocturia,  HEMATOLOGY: No anemia, easy bruising or bleeding SKIN: No rash or lesion. MUSCULOSKELETAL: No joint pain or arthritis.   NEUROLOGIC: No tingling, numbness, weakness.  PSYCHIATRY: No anxiety or depression.   MEDICATIONS AT HOME:  Prior to Admission medications   Medication Sig Start Date End Date Taking? Authorizing Provider  amLODipine (NORVASC) 5 MG tablet Take 5 mg by mouth daily. for high blood pressure 08/26/18  Yes [provider]  clonazePAM (KLONOPIN) 1 MG tablet Take 0.5-1 mg by mouth daily. 08/26/18  Yes [provider]  [employer] VITAMIN B12 1000 MCG tablet Take 1,000 mcg by mouth daily. 08/26/18  Yes [provider]  lamoTRIgine (LAMICTAL) 25 MG tablet Take 25 mg by mouth 2 (two) times daily. 07/21/18  Yes [provider]  lisinopril (PRINIVIL,ZESTRIL) 20 MG tablet Take 20 mg by mouth daily. for high blood pressure 08/22/18  Yes [provider]  metoprolol tartrate (LOPRESSOR) 25 MG tablet Take 25 mg by mouth 2 (two) times daily. 08/22/18  Yes [provider]  Omega-3 Fatty Acids (ODORLESS COATED FISH OIL) 1000 MG CPDR Take 1,000 mg by mouth 2 (two) times daily. 08/26/18  Yes [provider]  rOPINIRole (REQUIP) 0.25 MG tablet Take 0.25 mg by mouth 3 (three) times daily. 08/22/18  Yes [provider]  SPIRIVA HANDIHALER 18 MCG inhalation capsule Take 1 capsule by mouth daily. 06/28/18  Yes [provider]  venlafaxine XR (  EFFEXOR-XR) 75 MG 24 hr capsule Take 75-150 mg by mouth. Take 2 capsules (150mg ) in the morning, and 1 capsule (75mg ) in the evening 08/17/18  Yes [provider]      PHYSICAL EXAMINATION:   VITAL SIGNS: Blood pressure 132/83, pulse 64, temperature 98.1 F (36.7 C), temperature source Oral, resp. rate 16, height 4\' 9"  (1.448 m), weight 63.5 kg, SpO2 91  %.  GENERAL:  57 y.o.-year-old patient lying in the bed with no acute distress.  Overweight EYES: Pupils equal, round, reactive to light and accommodation. No scleral icterus. Extraocular muscles intact.  HEENT: Head atraumatic, normocephalic. Oropharynx and nasopharynx clear.  NECK:  Supple, no jugular venous distention. No thyroid enlargement, no tenderness.  LUNGS: Normal breath sounds bilaterally, no wheezing, rales,rhonchi or crepitation. No use of accessory muscles of respiration.  CARDIOVASCULAR: S1, S2 normal. No murmurs, rubs, or gallops.  ABDOMEN: Soft, epigastric tender, nondistended. Bowel sounds present. No organomegaly or mass.  EXTREMITIES: No pedal edema, cyanosis, or clubbing.  NEUROLOGIC: Cranial nerves II through XII are intact. Muscle strength 5/5 in all extremities. Sensation intact. Gait not checked.  PSYCHIATRIC: The patient is alert and oriented x 3.  SKIN: No obvious rash, lesion, or ulcer.   LABORATORY PANEL:   CBC Recent Labs  Lab 08/31/18 0929  WBC 9.2  HGB 14.7  HCT 44.7  PLT 275  MCV 101.4*  MCH 33.3  MCHC 32.9  RDW 13.6   ------------------------------------------------------------------------------------------------------------------  Chemistries  Recent Labs  Lab 08/31/18 0929  NA 137  K 3.6  CL 104  CO2 21*  GLUCOSE 153*  BUN 9  CREATININE 0.75  CALCIUM 9.1  AST 444*  ALT 672*  ALKPHOS 403*  BILITOT 2.4*   ------------------------------------------------------------------------------------------------------------------ estimated creatinine clearance is 59.5 mL/min (by C-G formula based on SCr of 0.75 mg/dL). ------------------------------------------------------------------------------------------------------------------ No results for input(s): TSH, T4TOTAL, T3FREE, THYROIDAB in the last 72 hours.  Invalid input(s): FREET3   Coagulation profile No results for input(s): INR, PROTIME in the last 168  hours. ------------------------------------------------------------------------------------------------------------------- No results for input(s): DDIMER in the last 72 hours. -------------------------------------------------------------------------------------------------------------------  Cardiac Enzymes No results for input(s): CKMB, TROPONINI, MYOGLOBIN in the last 168 hours.  Invalid input(s): CK ------------------------------------------------------------------------------------------------------------------ Invalid input(s): POCBNP  ---------------------------------------------------------------------------------------------------------------  Urinalysis    Component Value Date/Time   COLORURINE AMBER (A) 08/31/2018 0934   APPEARANCEUR CLEAR (A) 08/31/2018 0934   LABSPEC 1.023 08/31/2018 0934   PHURINE 5.0 08/31/2018 0934   GLUCOSEU NEGATIVE 08/31/2018 0934   HGBUR NEGATIVE 08/31/2018 0934   BILIRUBINUR NEGATIVE 08/31/2018 0934   KETONESUR NEGATIVE 08/31/2018 0934   PROTEINUR 30 (A) 08/31/2018 0934   NITRITE NEGATIVE 08/31/2018 0934   LEUKOCYTESUR NEGATIVE 08/31/2018 0934     RADIOLOGY: Ct Abdomen Pelvis W Contrast  Result Date: 08/31/2018 CLINICAL DATA:  Right upper quadrant pain for 3 days.  Vomiting. EXAM: CT ABDOMEN AND PELVIS WITH CONTRAST TECHNIQUE: Multidetector CT imaging of the abdomen and pelvis was performed using the standard protocol following bolus administration of intravenous contrast. CONTRAST:  ISOVUE-300 IOPAMIDOL (ISOVUE-300) INJECTION 61% COMPARISON:  None. FINDINGS: Lower Chest: No acute findings. Hepatobiliary: No hepatic masses identified. Mild-to-moderate diffuse hepatic steatosis. Gallbladder is nearly completely empty. Diffuse mucosal enhancement is seen within the gallbladder, without significant wall thickening or pericholecystic inflammatory changes. This finding is nonspecific and differential includes chronic cholecystitis, adenomyosis,  hepatocellular disease, and hypoalbuminemia, as well as other causes. No evidence of biliary ductal dilatation. Pancreas: No mass or inflammatory changes. No evidence of pancreatic ductal dilatation.  Spleen: Within normal limits in size and appearance. Adrenals/Urinary Tract: Diffuse left renal parenchymal atrophy and scarring. No masses identified. No evidence of hydronephrosis. Unremarkable unopacified urinary bladder. Stomach/Bowel: No evidence of obstruction, inflammatory process or abnormal fluid collections. Vascular/Lymphatic: No pathologically enlarged lymph nodes. No abdominal aortic aneurysm. Aortic atherosclerosis. Reproductive:  No mass or other significant abnormality. Other:  None. Musculoskeletal:  No suspicious bone lesions identified. IMPRESSION: Nearly empty gallbladder with diffuse mucosal enhancement. This finding is nonspecific, with differential diagnosis including chronic cholecystitis, adenomyosis, hepatocellular disease, and hypoalbuminemia. Mild-to-moderate hepatic steatosis. Electronically Signed   By: Myles Rosenthal M.D.   On: 08/31/2018 14:28   US Abdomen Limited Ruq  Result Date: 08/31/2018 CLINICAL DATA:  Epigastric pain for 3 days. EXAM: ULTRASOUND ABDOMEN LIMITED RIGHT UPPER QUADRANT COMPARISON:  None. FINDINGS: Gallbladder: The gallbladder is incompletely distended on this exam which limits evaluation. Multiple calcified gallstones are noted. Gallbladder wall thickening is likely due to underdistention. No sonographic Murphy sign noted by sonographer. Common bile duct: Diameter: 3 mm, within normal limits. Liver: No focal lesion identified. Within normal limits in parenchymal echogenicity. Portal vein is patent on color Doppler imaging with normal direction of blood flow towards the liver. IMPRESSION: Suboptimal visualization of gallbladder due to underdistention. Cholelithiasis noted, but no definite definite signs of acute cholecystitis or biliary ductal dilatation.  Electronically Signed   By: Myles Rosenthal M.D.   On: 08/31/2018 13:08    EKG: Orders placed or performed during the hospital encounter of 08/31/18  . ED EKG  . ED EKG  . EKG 12-Lead  . EKG 12-Lead    IMPRESSION AND PLAN: *Acute abdominal pain Suspected due to acute cholecystitis Admit to regular nursing floor bed, general surgery/Dr. Tonna Boehringer recommending MRCP, n.p.o. after midnight, adult pain protocol, check HIDA scan, CMP in the morning, check hepatitis panel  *Chronic tobacco smoker abuse/dependency Nicotine patch and cessation counseling ordered  *Chronic benign essential hypertension Stable Continue home regiment  *COPD without exacerbation Stable Breathing treatments as needed    All the records are reviewed and case discussed with ED provider. Management plans discussed with the patient, family and they are in agreement.  CODE STATUS:full   TOTAL TIME TAKING CARE OF THIS PATIENT: 40 minutes.    Evelena Asa Salary M.D on 08/31/2018   Between 7am to 6pm - Pager - [phone]  After 6pm go to [email or web address] - password [email or web address]  [facility]  Office  [phone]  CC: Primary care physician; Patient, No Pcp Per   Note: This dictation was prepared with Dragon dictation along with smaller phrase technology. Any transcriptional errors that result from this process are unintentional.

## 2018-08-31 NOTE — ED Notes (Signed)
Patient transported to Ultrasound 

## 2018-08-31 NOTE — Progress Notes (Signed)
Sent secure chat to Dr. Katheren ShamsSalary re: patient's scheduled home meds were reviewed and changed per patient reporting; needs reviewing as they are not correct for tonight.

## 2018-08-31 NOTE — H&P (View-Only) (Signed)
Subjective:   CC: adenomyomatosis  HPI:  Danielle Briggs is a 57 y.o. female who is consulted by [facility] for evaluation of above cc.  Symptoms were first noted a few days ago. Pain is sharp, constricting, wrapping around entire lower rib cage.  Associated with nausea/vomting, exacerbated by nothing specific.  First episode was a few days ago and not as severe, but this episode was worse so came to ED.  Workup revealed elevated LFTs, normal lipase, but negative imaging studies for choledocholithiasis and acute cholecystitis.  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    Patient currently is feeling better but still having some residual pain with no appetite.  Tolerating clears.     Past Medical History:  has a past medical history of Coronary artery disease, Hypertension, and MI (myocardial infarction) (HCC).  Past Surgical History:  has a past surgical history that includes Appendectomy and Tubal ligation.  Family History: reviewed and not relevant to CC  Social History:  reports that she has been smoking. She has never used smokeless tobacco. She reports that she drinks alcohol. She reports that she has current or past drug history. Drug: Marijuana.  Current Medications:  Medications Prior to Admission  Medication Sig Dispense Refill  . amLODipine (NORVASC) 5 MG tablet Take 5 mg by mouth daily. for high blood pressure  5  . clonazePAM (KLONOPIN) 1 MG tablet Take 0.5-1 mg by mouth 2 (two) times daily as needed.   0  . CVS VITAMIN B12 1000 MCG tablet Take 1,000 mcg by mouth daily.  11  . lamoTRIgine (LAMICTAL) 25 MG tablet Take 25 mg by mouth 2 (two) times daily.  0  . lisinopril (PRINIVIL,ZESTRIL) 20 MG tablet Take 20 mg by mouth daily. for high blood pressure  5  . metoprolol tartrate (LOPRESSOR) 25 MG tablet Take 25 mg by mouth 2 (two) times daily.  5  . Omega-3 Fatty Acids (ODORLESS COATED FISH OIL) 1000 MG CPDR Take 1,000 mg by mouth 2 (two) times daily.  5  . rOPINIRole  (REQUIP) 0.25 MG tablet Take 1.5 mg by mouth at bedtime.   5  . SPIRIVA HANDIHALER 18 MCG inhalation capsule Take 1 capsule by mouth daily.  5  . venlafaxine XR (EFFEXOR-XR) 75 MG 24 hr capsule Take 75-150 mg by mouth. Take 2 capsules (150mg) in the morning, and 1 capsule (75mg) in the evening  5    Allergies:  Allergies as of 08/31/2018 - Review Complete 08/31/2018  Allergen Reaction Noted  . Oxycodone Nausea Only 08/31/2018    ROS:  General: Denies weight loss, weight gain, fatigue, fevers, chills, and night sweats. Eyes: Denies blurry vision, double vision, eye pain, itchy eyes, and tearing. Ears: Denies hearing loss, earache, and ringing in ears. Nose: Denies sinus pain, congestion, infections, runny nose, and nosebleeds. Mouth/throat: Denies hoarseness, sore throat, bleeding gums, and difficulty swallowing. Heart: Denies chest pain, palpitations, racing heart, irregular heartbeat, leg pain or swelling, and decreased activity tolerance. Respiratory: Denies breathing difficulty, shortness of breath, wheezing, cough, and sputum. GI: Positive change in appetite, nausea, vomiting,  GU: Denies difficulty urinating, pain with urinating, urgency, frequency, blood in urine, and heavy menstrual bleeding. Musculoskeletal: Denies joint stiffness, pain, swelling, muscle weakness, and pain. Skin: Denies rash, itching, mass, tumors, sores, and boils Neurologic: Denies headache, fainting, dizziness, seizures, numbness, and tingling. Psychiatric: Denies depression, anxiety, difficulty sleeping, and memory loss. Endocrine: Denies heat or cold intolerance, and increased thirst or urination. Blood/lymph: Denies easy bruising, easy   bruising, and swollen glands  pertinent positives and negatives noted in HPI    Objective:     BP (!) 159/92 (BP Location: Right Arm)   Pulse 64   Temp (!) 97.4 F (36.3 C) (Oral)   Resp 20   Ht 4' 9" (1.448 m)   Wt 63.5 kg   SpO2 96%   BMI 30.30 kg/m     Constitutional :  alert, cooperative, appears stated age and no distress  Lymphatics/Throat:  no asymmetry, masses, or scars  Respiratory:  clear to auscultation bilaterally  Cardiovascular:  regular rate and rhythm  Gastrointestinal: soft, non-tender; bowel sounds normal; no masses,  no organomegaly.   Musculoskeletal: Steady gait and movement  Skin: Cool and moist  Psychiatric: Normal affect, non-agitated, not confused       LABS:  CMP Latest Ref Rng & Units 08/31/2018  Glucose 70 - 99 mg/dL 153(H)  BUN 6 - 20 mg/dL 9  Creatinine 0.44 - 1.00 mg/dL 0.75  Sodium 135 - 145 mmol/L 137  Potassium 3.5 - 5.1 mmol/L 3.6  Chloride 98 - 111 mmol/L 104  CO2 22 - 32 mmol/L 21(L)  Calcium 8.9 - 10.3 mg/dL 9.1  Total Protein 6.5 - 8.1 g/dL 8.2(H)  Total Bilirubin 0.3 - 1.2 mg/dL 2.4(H)  Alkaline Phos 38 - 126 U/L 403(H)  AST 15 - 41 U/L 444(H)  ALT 0 - 44 U/L 672(H)   CBC Latest Ref Rng & Units 08/31/2018  WBC 4.0 - 10.5 K/uL 9.2  Hemoglobin 12.0 - 15.0 g/dL 14.7  Hematocrit 36.0 - 46.0 % 44.7  Platelets 150 - 400 K/uL 275     RADS: CLINICAL DATA:  Epigastric pain for 3 days.  EXAM: ULTRASOUND ABDOMEN LIMITED RIGHT UPPER QUADRANT  COMPARISON:  None.  FINDINGS: Gallbladder:  The gallbladder is incompletely distended on this exam which limits evaluation. Multiple calcified gallstones are noted. Gallbladder wall thickening is likely due to underdistention. No sonographic Murphy sign noted by sonographer.  Common bile duct:  Diameter: 3 mm, within normal limits.  Liver:  No focal lesion identified. Within normal limits in parenchymal echogenicity. Portal vein is patent on color Doppler imaging with normal direction of blood flow towards the liver.  IMPRESSION: Suboptimal visualization of gallbladder due to underdistention. Cholelithiasis noted, but no definite definite signs of acute cholecystitis or biliary ductal dilatation.   Electronically Signed    By: John  Stahl M.D.   On: 08/31/2018 13:08  EXAM: CT ABDOMEN AND PELVIS WITH CONTRAST  TECHNIQUE: Multidetector CT imaging of the abdomen and pelvis was performed using the standard protocol following bolus administration of intravenous contrast.  CONTRAST:  100mL ISOVUE-300 IOPAMIDOL (ISOVUE-300) INJECTION 61%  COMPARISON:  None.  FINDINGS: Lower Chest: No acute findings.  Hepatobiliary: No hepatic masses identified. Mild-to-moderate diffuse hepatic steatosis.  Gallbladder is nearly completely empty. Diffuse mucosal enhancement is seen within the gallbladder, without significant wall thickening or pericholecystic inflammatory changes. This finding is nonspecific and differential includes chronic cholecystitis, adenomyosis, hepatocellular disease, and hypoalbuminemia, as well as other causes. No evidence of biliary ductal dilatation.  Pancreas: No mass or inflammatory changes. No evidence of pancreatic ductal dilatation.  Spleen: Within normal limits in size and appearance.  Adrenals/Urinary Tract: Diffuse left renal parenchymal atrophy and scarring. No masses identified. No evidence of hydronephrosis. Unremarkable unopacified urinary bladder.  Stomach/Bowel: No evidence of obstruction, inflammatory process or abnormal fluid collections.  Vascular/Lymphatic: No pathologically enlarged lymph nodes. No abdominal aortic aneurysm. Aortic atherosclerosis.  Reproductive:  No mass   or other significant abnormality.  Other:  None.  Musculoskeletal:  No suspicious bone lesions identified.  IMPRESSION: Nearly empty gallbladder with diffuse mucosal enhancement. This finding is nonspecific, with differential diagnosis including chronic cholecystitis, adenomyosis, hepatocellular disease, and hypoalbuminemia.  Mild-to-moderate hepatic steatosis.   Electronically Signed   By: John  Stahl M.D.   On: 08/31/2018 14:28  CLINICAL DATA:  Gallstones with  elevated liver function tests.  EXAM: MRI ABDOMEN WITHOUT AND WITH CONTRAST (INCLUDING MRCP)  TECHNIQUE: Multiplanar multisequence MR imaging of the abdomen was performed both before and after the administration of intravenous contrast. Heavily T2-weighted images of the biliary and pancreatic ducts were obtained, and three-dimensional MRCP images were rendered by post processing.  CONTRAST:  6 cc Gadavist.  COMPARISON:  CT scan from earlier the same day.  FINDINGS: Lower chest: Unremarkable.  Hepatobiliary: No focal abnormality within the liver parenchyma. The gallbladder is contracted with diffuse gallbladder wall thickening. T2 imaging shows multiple fluid-filled cysts in the wall of the gallbladder creating a "string of pearls" sign, characteristic for adenomyomatosis (see coronal T2 image 17 of series 12 and axial image 19 of series 4). No intra or extrahepatic biliary duct dilatation. Common duct measures 6 mm diameter. Common bile duct in the head of the pancreas measures 5 mm diameter.  Pancreas: No focal mass lesion. No dilatation of the main duct. No intraparenchymal cyst. No peripancreatic edema.  Spleen:  No splenomegaly. No focal mass lesion.  Adrenals/Urinary Tract: No adrenal nodule or mass. Right kidney unremarkable. Left kidney is atrophic.  Stomach/Bowel: Stomach is nondistended. No gastric wall thickening. No evidence of outlet obstruction. Duodenum is normally positioned as is the ligament of Treitz. No small bowel or colonic dilatation within the visualized abdomen.  Vascular/Lymphatic: No abdominal aortic aneurysm. No abdominal aortic atherosclerotic calcification. There is no gastrohepatic or hepatoduodenal ligament lymphadenopathy. No intraperitoneal or retroperitoneal lymphadenopathy.  Other:  No intraperitoneal free fluid.  Musculoskeletal: No abnormal marrow enhancement within the visualized bony anatomy.  IMPRESSION: 1.  Gallbladder is decompressed with diffuse gallbladder wall thickening. Mural hyperenhancement and the presence of numerous intramural cystic foci is characteristic for diffuse adenomyomatosis. No definite stones are evident although assessment limited by the contracted state of the gallbladder. No intrahepatic biliary duct dilatation . Extrahepatic bile duct upper normal for size. No evidence for choledocholithiasis   Electronically Signed   By: Eric  Mansell M.D.   On: 08/31/2018 19:03  Assessment:      Adenomyomatosis of GB Elevated LFTs   Plan:      Discussed the risk of surgery including post-op infxn, seroma, biloma, chronic pain, poor-delayed wound healing, retained gallstone, conversion to open procedure, post-op SBO or ileus, and need for additional procedures to address said risks.  The risks of general anesthetic including MI, CVA, sudden death or even reaction to anesthetic medications also discussed. Alternatives include continued observation.  Benefits include possible symptom relief, prevention of complications including acute cholecystitis, pancreatitis.  Typical post operative recovery of 3-5 days rest, continued pain in area and incision sites, possible loose stools up to 4-6 weeks, also discussed.  I explained to her pathophysiology of adenomyomatosis of the gallbladder.  Specifically said that these polyps themselves are not a precursor to cancer.  Also explained that the MRI has a fairly high specificity for said disease.  The description of her pain especially the bilateral nature of it is very atypical presentation for adenomyomatosis.  The elevated LFTs likely are result of a choledocholithiasis that has already resolved   on its own by the time MRCP was done.  Choledocholithiasis also does not typically present with bilateral wrapping pain starting in the back.  After explaining all this to her I reemphasized that removing her gallbladder does not guarantee that  her initial symptoms will resolve, and there is technically no oncological benefit of removing the adenomyomatosis.  I did state that he will likely resolve possible recurrences of choledocholithiasis.  Which is still likely the main cause of her elevated LFTs.  The patient understands the risks, any and all questions were answered to the patient's satisfaction.  She stated that she does not want to live with "polyps" for the rest of her life and if the pain does recur we can at least say that it is not the gallbladder that is causing the pain.  Therefore she is agreeable to proceeding with lap chole.  We will schedule as an add-on for tomorrow hopefully.  N.p.o. past midnight, sips with meds ok.  Repeat LFTs tomorrow morning to ensure that they are downtrending.  Nurse notified of plan as well.  

## 2018-09-01 ENCOUNTER — Inpatient Hospital Stay: Payer: Medicaid Other | Admitting: Anesthesiology

## 2018-09-01 ENCOUNTER — Encounter
Admission: EM | Disposition: A | Discharge status: Home / Self Care | Payer: Self-pay | Source: Home / Self Care | Attending: Internal Medicine

## 2018-09-01 HISTORY — PX: CHOLECYSTECTOMY: SHX55

## 2018-09-01 LAB — HEPATIC FUNCTION PANEL
ALT: 403 U/L — ABNORMAL HIGH (ref 0–44)
AST: 159 U/L — ABNORMAL HIGH (ref 15–41)
Albumin: 3.3 g/dL — ABNORMAL LOW (ref 3.5–5.0)
Alkaline Phosphatase: 318 U/L — ABNORMAL HIGH (ref 38–126)
Bilirubin, Direct: 0.2 mg/dL (ref 0.0–0.2)
Indirect Bilirubin: 0.7 mg/dL (ref 0.3–0.9)
Total Bilirubin: 0.9 mg/dL (ref 0.3–1.2)
Total Protein: 6.6 g/dL (ref 6.5–8.1)

## 2018-09-01 LAB — BASIC METABOLIC PANEL
Anion gap: 8 (ref 5–15)
BUN: 7 mg/dL (ref 6–20)
CO2: 23 mmol/L (ref 22–32)
Calcium: 8.3 mg/dL — ABNORMAL LOW (ref 8.9–10.3)
Chloride: 107 mmol/L (ref 98–111)
Creatinine, Ser: 0.71 mg/dL (ref 0.44–1.00)
GFR calc Af Amer: >60 mL/min (ref >60)
GFR calc non Af Amer: >60 mL/min (ref >60)
Glucose, Bld: 101 mg/dL — ABNORMAL HIGH (ref 70–99)
Potassium: 3.3 mmol/L — ABNORMAL LOW (ref 3.5–5.1)
Sodium: 138 mmol/L (ref 135–145)

## 2018-09-01 LAB — URINE DRUG SCREEN, QUALITATIVE (ARMC ONLY)
Amphetamines, Ur Screen: NOT DETECTED
Barbiturates, Ur Screen: NOT DETECTED
Benzodiazepine, Ur Scrn: NOT DETECTED
Cannabinoid 50 Ng, Ur ~~LOC~~: POSITIVE — AB
Cocaine Metabolite,Ur ~~LOC~~: NOT DETECTED
MDMA (ECSTASY) UR SCREEN: NOT DETECTED
Methadone Scn, Ur: NOT DETECTED
Opiate, Ur Screen: NOT DETECTED
Phencyclidine (PCP) Ur S: NOT DETECTED
Tricyclic, Ur Screen: NOT DETECTED

## 2018-09-01 LAB — CBC
HCT: 39.4 % (ref 36.0–46.0)
Hemoglobin: 13 g/dL (ref 12.0–15.0)
MCH: 34.1 pg — ABNORMAL HIGH (ref 26.0–34.0)
MCHC: 33 g/dL (ref 30.0–36.0)
MCV: 103.4 fL — ABNORMAL HIGH (ref 80.0–100.0)
Platelets: 207 10*3/uL (ref 150–400)
RBC: 3.81 MIL/uL — ABNORMAL LOW (ref 3.87–5.11)
RDW: 13.8 % (ref 11.5–15.5)
WBC: 4.3 10*3/uL (ref 4.0–10.5)
nRBC: 0 % (ref 0.0–0.2)

## 2018-09-01 LAB — SURGICAL PCR SCREEN
MRSA, PCR: NEGATIVE
STAPHYLOCOCCUS AUREUS: NEGATIVE

## 2018-09-01 LAB — PHOSPHORUS: Phosphorus: 3.3 mg/dL (ref 2.5–4.6)

## 2018-09-01 LAB — MAGNESIUM: Magnesium: 2.3 mg/dL (ref 1.7–2.4)

## 2018-09-01 SURGERY — LAPAROSCOPIC CHOLECYSTECTOMY
Anesthesia: General

## 2018-09-01 MED ORDER — DEXAMETHASONE SODIUM PHOSPHATE 10 MG/ML IJ SOLN
INTRAMUSCULAR | Status: DC | PRN
  Administered 2018-09-01: 8 mg via INTRAVENOUS

## 2018-09-01 MED ORDER — BUPIVACAINE HCL (PF) 0.5 % IJ SOLN
INTRAMUSCULAR | Status: AC
  Filled 2018-09-01: qty 30

## 2018-09-01 MED ORDER — EPHEDRINE SULFATE 50 MG/ML IJ SOLN
INTRAMUSCULAR | Status: DC | PRN
  Administered 2018-09-01 (×2): 10 mg via INTRAVENOUS

## 2018-09-01 MED ORDER — GLYCOPYRROLATE 0.2 MG/ML IJ SOLN
INTRAMUSCULAR | Status: DC | PRN
  Administered 2018-09-01: 2 mg via INTRAVENOUS

## 2018-09-01 MED ORDER — FENTANYL CITRATE (PF) 100 MCG/2ML IJ SOLN
INTRAMUSCULAR | Status: AC
  Filled 2018-09-01: qty 2

## 2018-09-01 MED ORDER — SODIUM CHLORIDE (PF) 0.9 % IJ SOLN
INTRAMUSCULAR | Status: AC
  Filled 2018-09-01: qty 20

## 2018-09-01 MED ORDER — ACETAMINOPHEN 10 MG/ML IV SOLN
INTRAVENOUS | Status: AC
  Filled 2018-09-01: qty 100

## 2018-09-01 MED ORDER — MIDAZOLAM HCL 2 MG/2ML IJ SOLN
INTRAMUSCULAR | Status: DC | PRN
  Administered 2018-09-01: 2 mg via INTRAVENOUS

## 2018-09-01 MED ORDER — KETOROLAC TROMETHAMINE 30 MG/ML IJ SOLN
INTRAMUSCULAR | Status: DC | PRN
  Administered 2018-09-01: 30 mg via INTRAVENOUS

## 2018-09-01 MED ORDER — FENTANYL CITRATE (PF) 100 MCG/2ML IJ SOLN
25.0000 ug | INTRAMUSCULAR | Status: DC | PRN
  Administered 2018-09-01 (×2): 25 ug via INTRAVENOUS

## 2018-09-01 MED ORDER — ACETAMINOPHEN 10 MG/ML IV SOLN
INTRAVENOUS | Status: DC | PRN
  Administered 2018-09-01: 1000 mg via INTRAVENOUS

## 2018-09-01 MED ORDER — LIDOCAINE HCL (CARDIAC) PF 100 MG/5ML IV SOSY
PREFILLED_SYRINGE | INTRAVENOUS | Status: DC | PRN
  Administered 2018-09-01: 80 mg via INTRAVENOUS

## 2018-09-01 MED ORDER — SUGAMMADEX SODIUM 500 MG/5ML IV SOLN
INTRAVENOUS | Status: AC
  Filled 2018-09-01: qty 5

## 2018-09-01 MED ORDER — SUGAMMADEX SODIUM 200 MG/2ML IV SOLN
INTRAVENOUS | Status: DC | PRN
  Administered 2018-09-01: 127 mg via INTRAVENOUS

## 2018-09-01 MED ORDER — LIDOCAINE-EPINEPHRINE (PF) 1 %-1:200000 IJ SOLN
INTRAMUSCULAR | Status: AC
  Filled 2018-09-01: qty 10

## 2018-09-01 MED ORDER — ROCURONIUM BROMIDE 100 MG/10ML IV SOLN
INTRAVENOUS | Status: DC | PRN
  Administered 2018-09-01: 50 mg via INTRAVENOUS

## 2018-09-01 MED ORDER — MIDAZOLAM HCL 2 MG/2ML IJ SOLN
INTRAMUSCULAR | Status: AC
  Filled 2018-09-01: qty 2

## 2018-09-01 MED ORDER — HYDROCODONE-ACETAMINOPHEN 5-325 MG PO TABS: 1.0000 | ORAL_TABLET | Freq: Four times a day (QID) | ORAL | Status: DC | PRN

## 2018-09-01 MED ORDER — LACTATED RINGERS IV SOLN
INTRAVENOUS | Status: DC
  Administered 2018-09-01 (×2): via INTRAVENOUS

## 2018-09-01 MED ORDER — EPHEDRINE SULFATE 50 MG/ML IJ SOLN
INTRAMUSCULAR | Status: AC
  Filled 2018-09-01: qty 1

## 2018-09-01 MED ORDER — FENTANYL CITRATE (PF) 100 MCG/2ML IJ SOLN
INTRAMUSCULAR | Status: DC | PRN
  Administered 2018-09-01 (×2): 50 ug via INTRAVENOUS

## 2018-09-01 MED ORDER — IBUPROFEN 400 MG PO TABS
800.0000 mg | ORAL_TABLET | Freq: Three times a day (TID) | ORAL | Status: DC | PRN
  Administered 2018-09-01: 800 mg via ORAL
  Filled 2018-09-01: qty 2

## 2018-09-01 MED ORDER — FENTANYL CITRATE (PF) 100 MCG/2ML IJ SOLN
INTRAMUSCULAR | Status: AC
  Administered 2018-09-01: 25 ug via INTRAVENOUS
  Filled 2018-09-01: qty 2

## 2018-09-01 MED ORDER — LIDOCAINE-EPINEPHRINE (PF) 1 %-1:200000 IJ SOLN
INTRAMUSCULAR | Status: DC | PRN
  Administered 2018-09-01: 24 mL via INTRAMUSCULAR

## 2018-09-01 MED ORDER — KETOROLAC TROMETHAMINE 30 MG/ML IJ SOLN
INTRAMUSCULAR | Status: AC
  Filled 2018-09-01: qty 1

## 2018-09-01 MED ORDER — CHLORHEXIDINE GLUCONATE CLOTH 2 % EX PADS
6.0000 | MEDICATED_PAD | Freq: Once | CUTANEOUS | Status: AC
  Administered 2018-09-01: 6 via TOPICAL

## 2018-09-01 MED ORDER — PROPOFOL 10 MG/ML IV BOLUS
INTRAVENOUS | Status: AC
  Filled 2018-09-01: qty 20

## 2018-09-01 MED ORDER — PROPOFOL 10 MG/ML IV BOLUS
INTRAVENOUS | Status: DC | PRN
  Administered 2018-09-01: 120 mg via INTRAVENOUS

## 2018-09-01 SURGICAL SUPPLY — 63 items
"PENCIL ELECTRO HAND CTR " (MISCELLANEOUS) ×1 IMPLANT
ADH SKN CLS APL DERMABOND .7 (GAUZE/BANDAGES/DRESSINGS) ×1
APL SWBSTK 6 STRL LF DISP (MISCELLANEOUS)
APPLICATOR COTTON TIP 6 STRL (MISCELLANEOUS) IMPLANT
APPLICATOR COTTON TIP 6IN STRL (MISCELLANEOUS) IMPLANT
APPLIER CLIP 5 13 M/L LIGAMAX5 (MISCELLANEOUS) ×3
APR CLP MED LRG 5 ANG JAW (MISCELLANEOUS) ×1
BAG SPEC RTRVL LRG 6X4 10 (ENDOMECHANICALS) ×1
BLADE SURG SZ11 CARB STEEL (BLADE) ×3 IMPLANT
CANISTER SUCT 1200ML W/VALVE (MISCELLANEOUS) ×3 IMPLANT
CHLORAPREP W/TINT 26ML (MISCELLANEOUS) ×3 IMPLANT
CHOLANGIOGRAM CATH TAUT (CATHETERS) IMPLANT
CLIP APPLIE 5 13 M/L LIGAMAX5 (MISCELLANEOUS) ×1 IMPLANT
COVER WAND RF STERILE (DRAPES) ×3 IMPLANT
DECANTER SPIKE VIAL GLASS SM (MISCELLANEOUS) ×2 IMPLANT
DEFOGGER SCOPE WARMER CLEARIFY (MISCELLANEOUS) ×2 IMPLANT
DERMABOND ADVANCED (GAUZE/BANDAGES/DRESSINGS) ×2
DERMABOND ADVANCED .7 DNX12 (GAUZE/BANDAGES/DRESSINGS) ×1 IMPLANT
DISSECTOR BLUNT TIP ENDO 5MM (MISCELLANEOUS) IMPLANT
DISSECTOR KITTNER STICK (MISCELLANEOUS) IMPLANT
DISSECTORS/KITTNER STICK (MISCELLANEOUS) ×3
DRAPE C-ARM XRAY 36X54 (DRAPES) IMPLANT
DRAPE SHEET LG 3/4 BI-LAMINATE (DRAPES) IMPLANT
ELECT CAUTERY BLADE 6.4 (BLADE) IMPLANT
ELECT REM PT RETURN 9FT ADLT (ELECTROSURGICAL) ×3
ELECTRODE REM PT RTRN 9FT ADLT (ELECTROSURGICAL) ×1 IMPLANT
GLOVE BIOGEL PI IND STRL 7.0 (GLOVE) ×1 IMPLANT
GLOVE BIOGEL PI INDICATOR 7.0 (GLOVE) ×6
GLOVE SURG SYN 7.0 (GLOVE) ×9 IMPLANT
GLOVE SURG SYN 7.0 PF PI (GLOVE) ×1 IMPLANT
GOWN STRL REUS W/ TWL LRG LVL3 (GOWN DISPOSABLE) ×3 IMPLANT
GOWN STRL REUS W/TWL LRG LVL3 (GOWN DISPOSABLE) ×9
GRASPER SUT TROCAR 14GX15 (MISCELLANEOUS) ×3 IMPLANT
HEMOSTAT SURGICEL 2X3 (HEMOSTASIS) ×2 IMPLANT
IRRIGATION STRYKERFLOW (MISCELLANEOUS) IMPLANT
IRRIGATOR STRYKERFLOW (MISCELLANEOUS)
IV CATH ANGIO 12GX3 LT BLUE (NEEDLE) IMPLANT
IV NS 1000ML (IV SOLUTION)
IV NS 1000ML BAXH (IV SOLUTION) IMPLANT
JACKSON PRATT 10 (INSTRUMENTS) IMPLANT
L-HOOK LAP DISP 36CM (ELECTROSURGICAL) ×3
LHOOK LAP DISP 36CM (ELECTROSURGICAL) ×1 IMPLANT
NEEDLE HYPO 22GX1.5 SAFETY (NEEDLE) ×3 IMPLANT
PACK LAP CHOLECYSTECTOMY (MISCELLANEOUS) ×3 IMPLANT
PENCIL ELECTRO HAND CTR (MISCELLANEOUS) ×3 IMPLANT
PORT ACCESS TROCAR AIRSEAL 5 (TROCAR) ×1 IMPLANT
POUCH SPECIMEN RETRIEVAL 10MM (ENDOMECHANICALS) ×3 IMPLANT
SCISSORS METZENBAUM CVD 33 (INSTRUMENTS) ×3 IMPLANT
SET TRI-LUMEN FLTR TB AIRSEAL (TUBING) ×1 IMPLANT
SLEEVE ENDOPATH XCEL 5M (ENDOMECHANICALS) ×5 IMPLANT
SPONGE LAP 18X18 RF (DISPOSABLE) IMPLANT
STOPCOCK 4 WAY LG BORE MALE ST (IV SETS) IMPLANT
SUT MNCRL 4-0 (SUTURE) ×3
SUT MNCRL 4-0 27XMFL (SUTURE) ×1
SUT VIC AB 3-0 SH 27 (SUTURE) ×3
SUT VIC AB 3-0 SH 27X BRD (SUTURE) IMPLANT
SUT VICRYL 0 AB UR-6 (SUTURE) ×8 IMPLANT
SUTURE MNCRL 4-0 27XMF (SUTURE) ×1 IMPLANT
SYR 20CC LL (SYRINGE) ×3 IMPLANT
TOWEL OR 17X26 4PK STRL BLUE (TOWEL DISPOSABLE) ×3 IMPLANT
TROCAR XCEL BLUNT TIP 100MML (ENDOMECHANICALS) ×3 IMPLANT
TROCAR XCEL NON-BLD 5MMX100MML (ENDOMECHANICALS) ×3 IMPLANT
WATER STERILE IRR 1000ML POUR (IV SOLUTION) ×3 IMPLANT

## 2018-09-01 NOTE — Progress Notes (Signed)
I smelled cigarette smoke in the patients room.  The CNA said she saw a cigarette but in the trash can in the bathroom.  I reminded the patient that we are a smoke free facility and she said she wouldn't think of smoking in the hospital

## 2018-09-01 NOTE — Anesthesia Preprocedure Evaluation (Signed)
Anesthesia Evaluation  Patient identified by MRN, date of birth, ID band Patient awake    Reviewed: Allergy & Precautions, H&P , NPO status , Patient's Chart, lab work & pertinent test results  History of Anesthesia Complications Negative for: history of anesthetic complications  Airway Mallampati: III  TM Distance: <3 FB Neck ROM: limited    Dental  (+) Upper Dentures, Lower Dentures   Pulmonary neg shortness of breath, COPD, Current Smoker,           Cardiovascular Exercise Tolerance: Good hypertension, (-) angina+ CAD and + Past MI  (-) DOE      Neuro/Psych negative neurological ROS  negative psych ROS   GI/Hepatic negative GI ROS, Neg liver ROS, neg GERD  ,  Endo/Other  negative endocrine ROS  Renal/GU      Musculoskeletal   Abdominal   Peds  Hematology negative hematology ROS (+)   Anesthesia Other Findings Past Medical History: No date: Coronary artery disease No date: Hypertension No date: MI (myocardial infarction) (HCC)  Past Surgical History: No date: APPENDECTOMY No date: TUBAL LIGATION  BMI    Body Mass Index:  29.26 kg/m      Reproductive/Obstetrics negative OB ROS                             Anesthesia Physical Anesthesia Plan  ASA: III  Anesthesia Plan: General ETT   Post-op Pain Management:    Induction: Intravenous  PONV Risk Score and Plan: Ondansetron, Dexamethasone, Midazolam and Treatment may vary due to age or medical condition  Airway Management Planned: Oral ETT  Additional Equipment:   Intra-op Plan:   Post-operative Plan: Extubation in OR  Informed Consent: I have reviewed the patients History and Physical, chart, labs and discussed the procedure including the risks, benefits and alternatives for the proposed anesthesia with the patient or authorized representative who has indicated his/her understanding and acceptance.   Dental  Advisory Given  Plan Discussed with: Anesthesiologist, CRNA and Surgeon  Anesthesia Plan Comments: (Patient consented for risks of anesthesia including but not limited to:  - adverse reactions to medications - damage to teeth, lips or other oral mucosa - sore throat or hoarseness - Damage to heart, brain, lungs or loss of life  Patient voiced understanding.)        Anesthesia Quick Evaluation

## 2018-09-01 NOTE — Op Note (Signed)
Preoperative diagnosis:  Adenomyomatosis of gallbladder  Postoperative diagnosis: same as above plus acute cholecystitis  Procedure: Laparoscopic Cholecystectomy.   Anesthesia: GETA   Surgeon: Sung AmabileIsami Krista Som  Specimen: Gallbladder  Complications: None  EBL: 15mL  Wound Classification: Clean Contaminated  Indications: see HPI  Findings: Critical view of safety noted Cystic duct and artery identified, ligated and divided, clips remained intact at end of procedure Adequate hemostasis  Description of procedure: The patient was placed on the operating table in the supine position. SCDs placed, pre-op abx administered.  General anesthesia was induced and OG tube placed by anesthesia. A time-out was completed verifying correct patient, procedure, site, positioning, and implant(s) and/or special equipment prior to beginning this procedure. The abdomen was prepped and draped in the usual sterile fashion.  An incision was made in a natural skin line under the umbilicus.  Dissection carried down to fascia where two 0 vicryl sutures placed to use as anchor sutures for hasson port.  Incision made into fascia and blunt dissection used to enter peritoneum.  Hasson port placed and insufflation started up to 15mm Hg without any dramatic increase in pressure.    The laparoscope was inserted and the abdomen inspected. No injuries from initial trocar placement were noted. Additional trocars were then inserted under direct visualization in the following locations: a 5-mm trocar in the subxyphoid region and two 5-mm trocars along the right costal margin. The abdomen was inspected and no abnormalities or injuries were found. The table was placed in the reverse Trendelenburg position with the right side up.  Filmy adhesions between the gallbladder and omentum, duodenum and transverse colon were lysed sharply. The dome of the gallbladder was grasped with an atraumatic grasper passed through the lateral port and  retracted over the dome of the liver. The infundibulum was also grasped with an atraumatic grasper and retracted toward the right lower quadrant. This maneuver exposed Calot's triangle. The peritoneum overlying the gallbladder infundibulum was then dissected and the cystic duct and cystic artery identified.  Critical view of safety with the liver bed clearly visible behind the duct and artery with no additional structures noted.  Picture taken before the cystic duct and cystic artery clipped and divided close to the gallbladder.  The gallbladder was then dissected from its peritoneal and liver bed attachments by electrocautery. Hemostasis was checked and the gallbladder was removed using an endoscopic retrieval bag placed through the umbilical port. The gallbladder was passed off the table as a specimen. The gallbladder fossa was copiously irrigated with saline and any leaked bile was suctioned out.  Oozing liver bed and omental attachments near the stump were controlled with surgicell left in place and hemostasis was obtained.  There was no further evidence of bleeding from the gallbladder fossa or cystic artery or leakage of the bile from the cystic duct stump. Abdomen desufflated and secondary trocars were removed under direct vision. No bleeding was noted. The laparoscope was withdrawn and the umbilical trocar removed.  The fascia of the Hasson trocar site was closed with figure-of-eight 0 vicryl sutures.  3-0 vicryl used to close deep dermal layer at umbilical site.  Remaining skin incisions then closed with subcuticular sutures of 4-0 monocryl and dressed with topical skin adhesive. The orogastric tube was removed and patient extubated. The patient tolerated the procedure well and was taken to the postanesthesia care unit in stable condition.  All sponge and instrument count correct at end of procedure.

## 2018-09-01 NOTE — Transfer of Care (Signed)
Immediate Anesthesia Transfer of Care Note  Patient: Danielle MussJennifer R Briggs  Procedure(s) Performed: LAPAROSCOPIC CHOLECYSTECTOMY-NO GRAMS (N/A )  Patient Location: PACU  Anesthesia Type:General  Level of Consciousness: awake  Airway & Oxygen Therapy: Patient Spontanous Breathing and Patient connected to face mask oxygen  Post-op Assessment: Report given to RN and Post -op Vital signs reviewed and stable  Post vital signs: Reviewed and stable  Last Vitals:  Vitals Value Taken Time  BP 132/81 09/01/2018  2:17 PM  Temp    Pulse 75 09/01/2018  2:22 PM  Resp 27 09/01/2018  2:22 PM  SpO2 98 % 09/01/2018  2:22 PM  Vitals shown include unvalidated device data.  Last Pain:  Vitals:   09/01/18 1112  TempSrc:   PainSc: 0-No pain         Complications: No apparent anesthesia complications

## 2018-09-01 NOTE — Interval H&P Note (Signed)
History and Physical Interval Note:  09/01/2018 12:17 PM  Danielle MussJennifer R Esqueda  has presented today for surgery, with the diagnosis of G  The various methods of treatment have been discussed with the patient and family. After consideration of risks, benefits and other options for treatment, the patient has consented to  Procedure(s): LAPAROSCOPIC CHOLECYSTECTOMY-NO GRAMS (N/A) as a surgical intervention .  The patient's history has been reviewed, patient examined, no change in status, stable for surgery.  I have reviewed the patient's chart and labs.  Questions were answered to the patient's satisfaction.     Ritvik Mczeal Tonna BoehringerSakai

## 2018-09-01 NOTE — Progress Notes (Signed)
Patient returned from PACU. Groggy but awake

## 2018-09-01 NOTE — Anesthesia Procedure Notes (Signed)
Procedure Name: Intubation Date/Time: 09/01/2018 12:59 PM Performed by: Allean Found, CRNA Pre-anesthesia Checklist: Patient identified, Patient being monitored, Timeout performed, Emergency Drugs available and Suction available Patient Re-evaluated:Patient Re-evaluated prior to induction Oxygen Delivery Method: Circle system utilized Preoxygenation: Pre-oxygenation with 100% oxygen Induction Type: IV induction Ventilation: Mask ventilation without difficulty Laryngoscope Size: Mac and 3 Grade View: Grade I Tube type: Oral Tube size: 7.0 mm Number of attempts: 1 Airway Equipment and Method: Stylet Placement Confirmation: ETT inserted through vocal cords under direct vision,  positive ETCO2 and breath sounds checked- equal and bilateral Secured at: 21 cm Tube secured with: Tape Dental Injury: Teeth and Oropharynx as per pre-operative assessment

## 2018-09-01 NOTE — Anesthesia Post-op Follow-up Note (Signed)
Anesthesia QCDR form completed.        

## 2018-09-01 NOTE — Progress Notes (Signed)
[facility] - [place] [facility]   PATIENT NAME: Danielle Briggs    MR#:  [id number]  DATE OF BIRTH:  [birth year]  SUBJECTIVE:   Patient seen this am going for surgery  REVIEW OF SYSTEMS:    Review of Systems  Constitutional: Negative for fever, chills weight loss HENT: Negative for ear pain, nosebleeds, congestion, facial swelling, rhinorrhea, neck pain, neck stiffness and ear discharge.   Respiratory: Negative for cough, shortness of breath, wheezing  Cardiovascular: Negative for chest pain, palpitations and leg swelling.  Gastrointestinal: Negative for heartburn,+abdominal pain, no vomiting, diarrhea or consitpation Genitourinary: Negative for dysuria, urgency, frequency, hematuria Musculoskeletal: Negative for back pain or joint pain Neurological: Negative for dizziness, seizures, syncope, focal weakness,  numbness and headaches.  Hematological: Does not bruise/bleed easily.  Psychiatric/Behavioral: Negative for hallucinations, confusion, dysphoric mood    Tolerating Diet: npo     DRUG ALLERGIES:   Allergies  Allergen Reactions  . Oxycodone Nausea Only    VITALS:  Blood pressure 138/90, pulse 72, temperature 97.8 F (36.6 C), temperature source Oral, resp. rate 20, height 4\' 10"  (1.473 m), weight 63.5 kg, SpO2 99 %.  PHYSICAL EXAMINATION:  Constitutional: Appears well-developed and well-nourished. No distress. HENT: Normocephalic. Marland Kitchen Oropharynx is clear and moist.  Eyes: Conjunctivae and EOM are normal. PERRLA, no scleral icterus.  Neck: Normal ROM. Neck supple. No JVD. No tracheal deviation. CVS: RRR, S1/S2 +, no murmurs, no gallops, no carotid bruit.  Pulmonary: Effort and breath sounds normal, no stridor, rhonchi, wheezes, rales.  Abdominal: Soft. BS +,  no distension, tenderness, rebound or guarding.  Musculoskeletal: Normal range of motion. No edema and no tenderness.  Neuro: Alert. CN 2-12 grossly intact. No focal deficits. Skin: Skin is  warm and dry. No rash noted. Psychiatric: Normal mood and affect.      LABORATORY PANEL:   CBC Recent Labs  Lab 09/01/18 0502  WBC 4.3  HGB 13.0  HCT 39.4  PLT 207   ------------------------------------------------------------------------------------------------------------------  Chemistries  Recent Labs  Lab 09/01/18 0502  NA 138  K 3.3*  CL 107  CO2 23  GLUCOSE 101*  BUN 7  CREATININE 0.71  CALCIUM 8.3*  MG 2.3  AST 159*  ALT 403*  ALKPHOS 318*  BILITOT 0.9   ------------------------------------------------------------------------------------------------------------------  Cardiac Enzymes No results for input(s): TROPONINI in the last 168 hours. ------------------------------------------------------------------------------------------------------------------  RADIOLOGY:  Ct Abdomen Pelvis W Contrast  Result Date: 08/31/2018 CLINICAL DATA:  Right upper quadrant pain for 3 days.  Vomiting. EXAM: CT ABDOMEN AND PELVIS WITH CONTRAST TECHNIQUE: Multidetector CT imaging of the abdomen and pelvis was performed using the standard protocol following bolus administration of intravenous contrast. CONTRAST:  ISOVUE-300 IOPAMIDOL (ISOVUE-300) INJECTION 61% COMPARISON:  None. FINDINGS: Lower Chest: No acute findings. Hepatobiliary: No hepatic masses identified. Mild-to-moderate diffuse hepatic steatosis. Gallbladder is nearly completely empty. Diffuse mucosal enhancement is seen within the gallbladder, without significant wall thickening or pericholecystic inflammatory changes. This finding is nonspecific and differential includes chronic cholecystitis, adenomyosis, hepatocellular disease, and hypoalbuminemia, as well as other causes. No evidence of biliary ductal dilatation. Pancreas: No mass or inflammatory changes. No evidence of pancreatic ductal dilatation. Spleen: Within normal limits in size and appearance. Adrenals/Urinary Tract: Diffuse left renal parenchymal atrophy  and scarring. No masses identified. No evidence of hydronephrosis. Unremarkable unopacified urinary bladder. Stomach/Bowel: No evidence of obstruction, inflammatory process or abnormal fluid collections. Vascular/Lymphatic: No pathologically enlarged lymph nodes. No abdominal aortic aneurysm. Aortic atherosclerosis. Reproductive:  No mass or  other significant abnormality. Other:  None. Musculoskeletal:  No suspicious bone lesions identified. IMPRESSION: Nearly empty gallbladder with diffuse mucosal enhancement. This finding is nonspecific, with differential diagnosis including chronic cholecystitis, adenomyosis, hepatocellular disease, and hypoalbuminemia. Mild-to-moderate hepatic steatosis. Electronically Signed   By: Myles Rosenthal M.D.   On: 08/31/2018 14:28   Mr 3d Recon At Scanner  Result Date: 08/31/2018 CLINICAL DATA:  Gallstones with elevated liver function tests. EXAM: MRI ABDOMEN WITHOUT AND WITH CONTRAST (INCLUDING MRCP) TECHNIQUE: Multiplanar multisequence MR imaging of the abdomen was performed both before and after the administration of intravenous contrast. Heavily T2-weighted images of the biliary and pancreatic ducts were obtained, and three-dimensional MRCP images were rendered by post processing. CONTRAST:  6 cc Gadavist. COMPARISON:  CT scan from earlier the same day. FINDINGS: Lower chest: Unremarkable. Hepatobiliary: No focal abnormality within the liver parenchyma. The gallbladder is contracted with diffuse gallbladder wall thickening. T2 imaging shows multiple fluid-filled cysts in the wall of the gallbladder creating a "string of pearls" sign, characteristic for adenomyomatosis (see coronal T2 image 17 of series 12 and axial image 19 of series 4). No intra or extrahepatic biliary duct dilatation. Common duct measures 6 mm diameter. Common bile duct in the head of the pancreas measures 5 mm diameter. Pancreas: No focal mass lesion. No dilatation of the main duct. No intraparenchymal cyst. No  peripancreatic edema. Spleen:  No splenomegaly. No focal mass lesion. Adrenals/Urinary Tract: No adrenal nodule or mass. Right kidney unremarkable. Left kidney is atrophic. Stomach/Bowel: Stomach is nondistended. No gastric wall thickening. No evidence of outlet obstruction. Duodenum is normally positioned as is the ligament of Treitz. No small bowel or colonic dilatation within the visualized abdomen. Vascular/Lymphatic: No abdominal aortic aneurysm. No abdominal aortic atherosclerotic calcification. There is no gastrohepatic or hepatoduodenal ligament lymphadenopathy. No intraperitoneal or retroperitoneal lymphadenopathy. Other:  No intraperitoneal free fluid. Musculoskeletal: No abnormal marrow enhancement within the visualized bony anatomy. IMPRESSION: 1. Gallbladder is decompressed with diffuse gallbladder wall thickening. Mural hyperenhancement and the presence of numerous intramural cystic foci is characteristic for diffuse adenomyomatosis. No definite stones are evident although assessment limited by the contracted state of the gallbladder. No intrahepatic biliary duct dilatation . Extrahepatic bile duct upper normal for size. No evidence for choledocholithiasis Electronically Signed   By: Kennith Center M.D.   On: 08/31/2018 19:03   Mr Abdomen Mrcp Vivien Rossetti Contast  Result Date: 08/31/2018 CLINICAL DATA:  Gallstones with elevated liver function tests. EXAM: MRI ABDOMEN WITHOUT AND WITH CONTRAST (INCLUDING MRCP) TECHNIQUE: Multiplanar multisequence MR imaging of the abdomen was performed both before and after the administration of intravenous contrast. Heavily T2-weighted images of the biliary and pancreatic ducts were obtained, and three-dimensional MRCP images were rendered by post processing. CONTRAST:  6 cc Gadavist. COMPARISON:  CT scan from earlier the same day. FINDINGS: Lower chest: Unremarkable. Hepatobiliary: No focal abnormality within the liver parenchyma. The gallbladder is contracted with  diffuse gallbladder wall thickening. T2 imaging shows multiple fluid-filled cysts in the wall of the gallbladder creating a "string of pearls" sign, characteristic for adenomyomatosis (see coronal T2 image 17 of series 12 and axial image 19 of series 4). No intra or extrahepatic biliary duct dilatation. Common duct measures 6 mm diameter. Common bile duct in the head of the pancreas measures 5 mm diameter. Pancreas: No focal mass lesion. No dilatation of the main duct. No intraparenchymal cyst. No peripancreatic edema. Spleen:  No splenomegaly. No focal mass lesion. Adrenals/Urinary Tract: No adrenal nodule or mass.  Right kidney unremarkable. Left kidney is atrophic. Stomach/Bowel: Stomach is nondistended. No gastric wall thickening. No evidence of outlet obstruction. Duodenum is normally positioned as is the ligament of Treitz. No small bowel or colonic dilatation within the visualized abdomen. Vascular/Lymphatic: No abdominal aortic aneurysm. No abdominal aortic atherosclerotic calcification. There is no gastrohepatic or hepatoduodenal ligament lymphadenopathy. No intraperitoneal or retroperitoneal lymphadenopathy. Other:  No intraperitoneal free fluid. Musculoskeletal: No abnormal marrow enhancement within the visualized bony anatomy. IMPRESSION: 1. Gallbladder is decompressed with diffuse gallbladder wall thickening. Mural hyperenhancement and the presence of numerous intramural cystic foci is characteristic for diffuse adenomyomatosis. No definite stones are evident although assessment limited by the contracted state of the gallbladder. No intrahepatic biliary duct dilatation . Extrahepatic bile duct upper normal for size. No evidence for choledocholithiasis Electronically Signed   By: Kennith CenterEric  Mansell M.D.   On: 08/31/2018 19:03   [place] Abdomen Limited Ruq  Result Date: 08/31/2018 CLINICAL DATA:  Epigastric pain for 3 days. EXAM: ULTRASOUND ABDOMEN LIMITED RIGHT UPPER QUADRANT COMPARISON:  None. FINDINGS:  Gallbladder: The gallbladder is incompletely distended on this exam which limits evaluation. Multiple calcified gallstones are noted. Gallbladder wall thickening is likely due to underdistention. No sonographic Murphy sign noted by sonographer. Common bile duct: Diameter: 3 mm, within normal limits. Liver: No focal lesion identified. Within normal limits in parenchymal echogenicity. Portal vein is patent on color Doppler imaging with normal direction of blood flow towards the liver. IMPRESSION: Suboptimal visualization of gallbladder due to underdistention. Cholelithiasis noted, but no definite definite signs of acute cholecystitis or biliary ductal dilatation. Electronically Signed   By: Myles RosenthalJohn  Stahl M.D.   On: 08/31/2018 13:08     ASSESSMENT AND PLAN:   57 y/o female with CAD who presented with abdominal pain.  1.  Abdominal pain with Adenomyomatosis of GB: Patient is for lap chole today  2.  Elevated LFTs which are result of choledocholithiasis which has resolved  3.  CAD: Continue metoprolol, lisinopril  4.  Depression/anxiety: Continue Effexor, Lamictal  5.  Essential hypertension: Continue Norvasc, lisinopril, metoprolol  6. Tobacco dependence: Patient is encouraged to quit smoking. Counseling was provided for 4 minutes.   Management plans discussed with the patient and she is in agreement.  CODE STATUS: full  TOTAL TIME TAKING CARE OF THIS PATIENT: 30 minutes.     POSSIBLE D/C tomorrow, DEPENDING ON CLINICAL CONDITION.   Elliyah Liszewski M.D on 09/01/2018 at 1:04 PM  Between 7am to 6pm - Pager - [phone] After 6pm go to [email or web address] - password [email or web address]  [facility]  Office  [phone]  CC: Primary care physician; Patient, No Pcp Per  Note: This dictation was prepared with Dragon dictation along with smaller phrase technology. Any transcriptional errors that result from this process are unintentional.

## 2018-09-01 NOTE — Plan of Care (Signed)
  Problem: Education: Goal: Knowledge of General Education information will improve Description: Including pain rating scale, medication(s)/side effects and non-pharmacologic comfort measures Outcome: Progressing   Problem: Safety: Goal: Ability to remain free from injury will improve Outcome: Progressing   

## 2018-09-01 NOTE — Progress Notes (Signed)
Order received from Dr Tonna BoehringerSakai for motrin and norco

## 2018-09-01 NOTE — Anesthesia Postprocedure Evaluation (Signed)
Anesthesia Post Note  Patient: Danielle MussJennifer R Briggs  Procedure(s) Performed: LAPAROSCOPIC CHOLECYSTECTOMY-NO GRAMS (N/A )  Patient location during evaluation: PACU Anesthesia Type: General Level of consciousness: awake and alert and oriented Pain management: pain level controlled Vital Signs Assessment: post-procedure vital signs reviewed and stable Respiratory status: spontaneous breathing, nonlabored ventilation and respiratory function stable Cardiovascular status: blood pressure returned to baseline and stable Postop Assessment: no signs of nausea or vomiting Anesthetic complications: no     Last Vitals:  Vitals:   09/01/18 1439 09/01/18 1447  BP:  140/73  Pulse: 83 68  Resp: 15 15  Temp:    SpO2: 100% 97%    Last Pain:  Vitals:   09/01/18 1447  TempSrc:   PainSc: 6                  Chane Magner

## 2018-09-02 ENCOUNTER — Encounter: Payer: Self-pay | Admitting: Surgery

## 2018-09-02 LAB — HEPATITIS PANEL, ACUTE
HCV Ab: <0.1 s/co ratio (ref 0.0–0.9)
Hep A IgM: NEGATIVE
Hep B C IgM: NEGATIVE
Hepatitis B Surface Ag: NEGATIVE

## 2018-09-02 LAB — COMPREHENSIVE METABOLIC PANEL
ALT: 261 U/L — ABNORMAL HIGH (ref 0–44)
AST: 61 U/L — ABNORMAL HIGH (ref 15–41)
Albumin: 3.5 g/dL (ref 3.5–5.0)
Alkaline Phosphatase: 291 U/L — ABNORMAL HIGH (ref 38–126)
Anion gap: 6 (ref 5–15)
BUN: 9 mg/dL (ref 6–20)
CO2: 23 mmol/L (ref 22–32)
CREATININE: 0.72 mg/dL (ref 0.44–1.00)
Calcium: 8.9 mg/dL (ref 8.9–10.3)
Chloride: 109 mmol/L (ref 98–111)
GFR calc Af Amer: >60 mL/min (ref >60)
Glucose, Bld: 130 mg/dL — ABNORMAL HIGH (ref 70–99)
Potassium: 3.4 mmol/L — ABNORMAL LOW (ref 3.5–5.1)
Sodium: 138 mmol/L (ref 135–145)
Total Bilirubin: 0.5 mg/dL (ref 0.3–1.2)
Total Protein: 6.7 g/dL (ref 6.5–8.1)

## 2018-09-02 LAB — HIV ANTIBODY (ROUTINE TESTING W REFLEX): HIV Screen 4th Generation wRfx: NONREACTIVE

## 2018-09-02 MED ORDER — HYDROCODONE-ACETAMINOPHEN 5-325 MG PO TABS: 1.0000 | ORAL_TABLET | Freq: Four times a day (QID) | ORAL | 0 refills | Status: DC | PRN

## 2018-09-02 MED ORDER — POTASSIUM CHLORIDE CRYS ER 20 MEQ PO TBCR
40.0000 meq | EXTENDED_RELEASE_TABLET | Freq: Once | ORAL | Status: AC
  Administered 2018-09-02: 40 meq via ORAL
  Filled 2018-09-02: qty 2

## 2018-09-02 MED ORDER — NICOTINE 14 MG/24HR TD PT24: 14.0000 mg | MEDICATED_PATCH | Freq: Every day | TRANSDERMAL | 0 refills | Status: AC

## 2018-09-02 NOTE — Progress Notes (Signed)
Patient discharge teaching given, including activity, diet, follow-up appoints, and medications. Patient verbalized understanding of all discharge instructions. IV access was d/c'd. Vitals are stable. Skin is intact except as charted in most recent assessments. Pt refused to be escorted out, to be driven home by family.  Danielle Briggs  

## 2018-09-02 NOTE — Progress Notes (Signed)
Subjective:  CC:  Danielle MussJennifer R Briggs is a 57 y.o. female  Hospital stay day 2, 1 Day Post-Op lap appy  HPI: No issue overnight.  Slight soreness in area.  ROS:  A 5 point review of systems was performed and pertinent positives and negatives noted in HPI.   Objective:      Temp:  [97.2 F (36.2 C)-98.6 F (37 C)] 98.5 F (36.9 C) (12/10 0456) Pulse Rate:  [65-83] 69 (12/10 0456) Resp:  [15-29] 18 (12/10 0456) BP: (123-145)/(73-90) 141/76 (12/10 0456) SpO2:  [92 %-100 %] 96 % (12/10 0456) Weight:  [63.5 kg] 63.5 kg (12/09 1112)     Height: 4\' 10"  (147.3 cm) Weight: 63.5 kg BMI (Calculated): 29.27   Intake/Output this shift:   Intake/Output Summary (Last 24 hours) at 09/02/2018 0759 Last data filed at 09/02/2018 [id number] Gross per 24 hour  Intake 1705.16 ml  Output 2850 ml  Net -1144.84 ml        Constitutional :  alert, cooperative, appears stated age and no distress  Respiratory:  clear to auscultation bilaterally  Cardiovascular:  regular rate and rhythm  Gastrointestinal: soft, non-tender; bowel sounds normal; no masses,  no organomegaly. Slight distention secondary to insufflation intraop  Skin: Cool and moist. inicisions c/d/i  Psychiatric: Normal affect, non-agitated, not confused       LABS:  CMP Latest Ref Rng & Units 09/02/2018 09/01/2018 08/31/2018  Glucose 70 - 99 mg/dL 875(I130(H) 433(I101(H) 951(O153(H)  BUN 6 - 20 mg/dL 9 7 9   Creatinine 0.44 - 1.00 mg/dL 8.410.72 6.600.71 6.300.75  Sodium 135 - 145 mmol/L 138 138 137  Potassium 3.5 - 5.1 mmol/L 3.4(L) 3.3(L) 3.6  Chloride 98 - 111 mmol/L 109 107 104  CO2 22 - 32 mmol/L 23 23 21(L)  Calcium 8.9 - 10.3 mg/dL 8.9 1.6(W8.3(L) 9.1  Total Protein 6.5 - 8.1 g/dL 6.7 6.6 1.0(X8.2(H)  Total Bilirubin 0.3 - 1.2 mg/dL 0.5 0.9 3.2(T2.4(H)  Alkaline Phos 38 - 126 U/L 291(H) 318(H) 403(H)  AST 15 - 41 U/L 61(H) 159(H) 444(H)  ALT 0 - 44 U/L 261(H) 403(H) 672(H)   CBC Latest Ref Rng & Units 09/01/2018 08/31/2018  WBC 4.0 - 10.5 K/uL 4.3 9.2  Hemoglobin  12.0 - 15.0 g/dL 55.713.0 32.214.7  Hematocrit 02.536.0 - 46.0 % 39.4 44.7  Platelets 150 - 400 K/uL 207 275    RADS: n/a Assessment:   S/p lap appy.  Doing well.  Ok to d/c from surgery standpoint.  F/u in office two weeks.  Ok to shower today.  Tylenol/motrin, norco prn for pain as noted below.   tylenol and advil as needed for discomfort.  Please alternate between the two every four hours as needed for pain.    Use norco, one tab q6hr prn severe pain Tylenol 325-650mg  every 8hrs to max of 4000mg /24hrs (including the 325mg  in every norco dose) for the tylenol.    Advil up to 800mg  per dose every 8hrs as needed for pain.

## 2018-09-02 NOTE — Discharge Summary (Signed)
[employer] - [place] at [facility]   PATIENT NAME: Danielle Briggs    MR#:  [id number]  DATE OF BIRTH:  1961/09/16  DATE OF ADMISSION:  08/31/2018 ADMITTING PHYSICIAN: Bertrum Sol, MD  DATE OF DISCHARGE: 09/02/2018  PRIMARY CARE PHYSICIAN: Patient, No Pcp Per    ADMISSION DIAGNOSIS:  Epigastric pain [R10.13] Elevated LFTs [R94.5]  DISCHARGE DIAGNOSIS:  Active Problems:   Abdominal pain   SECONDARY DIAGNOSIS:   Past Medical History:  Diagnosis Date  . Coronary artery disease   . Hypertension   . MI (myocardial infarction) [facility])     HOSPITAL COURSE:    57 y/o female with CAD who presented with abdominal pain.  1.  Abdominal pain with Adenomyomatosis of GB: Patient is for POD #1 lap chole and doing well. She will follow up with Surgery in 1 week.   2.  Elevated LFTs which are result of choledocholithiasis which has resolved  3.  CAD: Continue metoprolol, lisinopril  4.  Depression/anxiety: Continue Effexor, Lamictal  5.  Essential hypertension: Continue Norvasc, lisinopril, metoprolol  6. Tobacco dependence: Patient is encouraged to quit smoking. Counseling was provided.  DISCHARGE CONDITIONS AND DIET:   Stable for discharge on heart healthy diet  CONSULTS OBTAINED:  Treatment Team:  Sung Amabile, DO  DRUG ALLERGIES:   Allergies  Allergen Reactions  . Oxycodone Nausea Only    DISCHARGE MEDICATIONS:   Allergies as of 09/02/2018      Reactions   Oxycodone Nausea Only      Medication List    TAKE these medications   amLODipine 5 MG tablet Commonly known as:  NORVASC Take 5 mg by mouth daily. for high blood pressure   clonazePAM 1 MG tablet Commonly known as:  KLONOPIN Take 0.5-1 mg by mouth 2 (two) times daily as needed.   [employer] VITAMIN B12 1000 MCG tablet Generic drug:  cyanocobalamin Take 1,000 mcg by mouth daily.   HYDROcodone-acetaminophen 5-325 MG tablet Commonly known as:  NORCO/VICODIN Take 1-2 tablets  by mouth every 6 (six) hours as needed for moderate pain.   lamoTRIgine 25 MG tablet Commonly known as:  LAMICTAL Take 25 mg by mouth 2 (two) times daily.   lisinopril 20 MG tablet Commonly known as:  PRINIVIL,ZESTRIL Take 20 mg by mouth daily. for high blood pressure   metoprolol tartrate 25 MG tablet Commonly known as:  LOPRESSOR Take 25 mg by mouth 2 (two) times daily.   nicotine 14 mg/24hr patch Commonly known as:  NICODERM CQ - dosed in mg/24 hours Place 1 patch (14 mg total) onto the skin daily. Start taking on:  09/03/2018   ODORLESS COATED FISH OIL 1000 MG Cpdr Take 1,000 mg by mouth 2 (two) times daily.   rOPINIRole 0.25 MG tablet Commonly known as:  REQUIP Take 1.5 mg by mouth at bedtime.   SPIRIVA HANDIHALER 18 MCG inhalation capsule Generic drug:  tiotropium Take 1 capsule by mouth daily.   venlafaxine XR 75 MG 24 hr capsule Commonly known as:  EFFEXOR-XR Take 75-150 mg by mouth. Take 2 capsules (150mg ) in the morning, and 1 capsule (75mg ) in the evening         Today   CHIEF COMPLAINT:  Patient is doing well this morning.  Denies abdominal pain at this time.  Tolerating her diet.  Evaluated by surgery this morning.  Cleared for discharge today.   VITAL SIGNS:  Blood pressure (!) 151/95, pulse 75, temperature 98.5 F (36.9 C), temperature source Oral,  resp. rate 18, height 4\' 10"  (1.473 m), weight 63.5 kg, SpO2 96 %.   REVIEW OF SYSTEMS:  Review of Systems  Constitutional: Negative.  Negative for chills, fever and malaise/fatigue.  HENT: Negative.  Negative for ear discharge, ear pain, hearing loss, nosebleeds and sore throat.   Eyes: Negative.  Negative for blurred vision and pain.  Respiratory: Negative.  Negative for cough, hemoptysis, shortness of breath and wheezing.   Cardiovascular: Negative.  Negative for chest pain, palpitations and leg swelling.  Gastrointestinal: Negative.  Negative for abdominal pain, blood in stool, diarrhea, nausea  and vomiting.  Genitourinary: Negative.  Negative for dysuria.  Musculoskeletal: Negative.  Negative for back pain.  Skin: Negative.   Neurological: Negative for dizziness, tremors, speech change, focal weakness, seizures and headaches.  Endo/Heme/Allergies: Negative.  Does not bruise/bleed easily.  Psychiatric/Behavioral: Negative.  Negative for depression, hallucinations and suicidal ideas.     PHYSICAL EXAMINATION:  GENERAL:  57 y.o.-year-old patient lying in the bed with no acute distress.  NECK:  Supple, no jugular venous distention. No thyroid enlargement, no tenderness.  LUNGS: Normal breath sounds bilaterally, no wheezing, rales,rhonchi  No use of accessory muscles of respiration.  CARDIOVASCULAR: S1, S2 normal. No murmurs, rubs, or gallops.  ABDOMEN: Soft, non-tender, and only-distended. Bowel sounds present. No organomegaly or mass.  EXTREMITIES: No pedal edema, cyanosis, or clubbing.  PSYCHIATRIC: The patient is alert and oriented x 3.  SKIN: No obvious rash, lesion, or ulcer.  Incisions clear without drainage  DATA REVIEW:   CBC Recent Labs  Lab 09/01/18 0502  WBC 4.3  HGB 13.0  HCT 39.4  PLT 207    Chemistries  Recent Labs  Lab 09/01/18 0502 09/02/18 0402  NA 138 138  K 3.3* 3.4*  CL 107 109  CO2 23 23  GLUCOSE 101* 130*  BUN 7 9  CREATININE 0.71 0.72  CALCIUM 8.3* 8.9  MG 2.3  --   AST 159* 61*  ALT 403* 261*  ALKPHOS 318* 291*  BILITOT 0.9 0.5    Cardiac Enzymes No results for input(s): TROPONINI in the last 168 hours.  Microbiology Results  @[id number] @  RADIOLOGY:  Ct Abdomen Pelvis W Contrast  Result Date: 08/31/2018 CLINICAL DATA:  Right upper quadrant pain for 3 days.  Vomiting. EXAM: CT ABDOMEN AND PELVIS WITH CONTRAST TECHNIQUE: Multidetector CT imaging of the abdomen and pelvis was performed using the standard protocol following bolus administration of intravenous contrast. CONTRAST:  100mL ISOVUE-300 IOPAMIDOL (ISOVUE-300)  INJECTION 61% COMPARISON:  None. FINDINGS: Lower Chest: No acute findings. Hepatobiliary: No hepatic masses identified. Mild-to-moderate diffuse hepatic steatosis. Gallbladder is nearly completely empty. Diffuse mucosal enhancement is seen within the gallbladder, without significant wall thickening or pericholecystic inflammatory changes. This finding is nonspecific and differential includes chronic cholecystitis, adenomyosis, hepatocellular disease, and hypoalbuminemia, as well as other causes. No evidence of biliary ductal dilatation. Pancreas: No mass or inflammatory changes. No evidence of pancreatic ductal dilatation. Spleen: Within normal limits in size and appearance. Adrenals/Urinary Tract: Diffuse left renal parenchymal atrophy and scarring. No masses identified. No evidence of hydronephrosis. Unremarkable unopacified urinary bladder. Stomach/Bowel: No evidence of obstruction, inflammatory process or abnormal fluid collections. Vascular/Lymphatic: No pathologically enlarged lymph nodes. No abdominal aortic aneurysm. Aortic atherosclerosis. Reproductive:  No mass or other significant abnormality. Other:  None. Musculoskeletal:  No suspicious bone lesions identified. IMPRESSION: Nearly empty gallbladder with diffuse mucosal enhancement. This finding is nonspecific, with differential diagnosis including chronic cholecystitis, adenomyosis, hepatocellular disease, and hypoalbuminemia. Mild-to-moderate hepatic steatosis.  Electronically Signed   By: Myles Rosenthal M.D.   On: 08/31/2018 14:28   Mr 3d Recon At Scanner  Result Date: 08/31/2018 CLINICAL DATA:  Gallstones with elevated liver function tests. EXAM: MRI ABDOMEN WITHOUT AND WITH CONTRAST (INCLUDING MRCP) TECHNIQUE: Multiplanar multisequence MR imaging of the abdomen was performed both before and after the administration of intravenous contrast. Heavily T2-weighted images of the biliary and pancreatic ducts were obtained, and three-dimensional MRCP images  were rendered by post processing. CONTRAST:  6 cc Gadavist. COMPARISON:  CT scan from earlier the same day. FINDINGS: Lower chest: Unremarkable. Hepatobiliary: No focal abnormality within the liver parenchyma. The gallbladder is contracted with diffuse gallbladder wall thickening. T2 imaging shows multiple fluid-filled cysts in the wall of the gallbladder creating a "string of pearls" sign, characteristic for adenomyomatosis (see coronal T2 image 17 of series 12 and axial image 19 of series 4). No intra or extrahepatic biliary duct dilatation. Common duct measures 6 mm diameter. Common bile duct in the head of the pancreas measures 5 mm diameter. Pancreas: No focal mass lesion. No dilatation of the main duct. No intraparenchymal cyst. No peripancreatic edema. Spleen:  No splenomegaly. No focal mass lesion. Adrenals/Urinary Tract: No adrenal nodule or mass. Right kidney unremarkable. Left kidney is atrophic. Stomach/Bowel: Stomach is nondistended. No gastric wall thickening. No evidence of outlet obstruction. Duodenum is normally positioned as is the ligament of Treitz. No small bowel or colonic dilatation within the visualized abdomen. Vascular/Lymphatic: No abdominal aortic aneurysm. No abdominal aortic atherosclerotic calcification. There is no gastrohepatic or hepatoduodenal ligament lymphadenopathy. No intraperitoneal or retroperitoneal lymphadenopathy. Other:  No intraperitoneal free fluid. Musculoskeletal: No abnormal marrow enhancement within the visualized bony anatomy. IMPRESSION: 1. Gallbladder is decompressed with diffuse gallbladder wall thickening. Mural hyperenhancement and the presence of numerous intramural cystic foci is characteristic for diffuse adenomyomatosis. No definite stones are evident although assessment limited by the contracted state of the gallbladder. No intrahepatic biliary duct dilatation . Extrahepatic bile duct upper normal for size. No evidence for choledocholithiasis  Electronically Signed   By: Kennith Center M.D.   On: 08/31/2018 19:03   Mr Abdomen Mrcp Vivien Rossetti Contast  Result Date: 08/31/2018 CLINICAL DATA:  Gallstones with elevated liver function tests. EXAM: MRI ABDOMEN WITHOUT AND WITH CONTRAST (INCLUDING MRCP) TECHNIQUE: Multiplanar multisequence MR imaging of the abdomen was performed both before and after the administration of intravenous contrast. Heavily T2-weighted images of the biliary and pancreatic ducts were obtained, and three-dimensional MRCP images were rendered by post processing. CONTRAST:  6 cc Gadavist. COMPARISON:  CT scan from earlier the same day. FINDINGS: Lower chest: Unremarkable. Hepatobiliary: No focal abnormality within the liver parenchyma. The gallbladder is contracted with diffuse gallbladder wall thickening. T2 imaging shows multiple fluid-filled cysts in the wall of the gallbladder creating a "string of pearls" sign, characteristic for adenomyomatosis (see coronal T2 image 17 of series 12 and axial image 19 of series 4). No intra or extrahepatic biliary duct dilatation. Common duct measures 6 mm diameter. Common bile duct in the head of the pancreas measures 5 mm diameter. Pancreas: No focal mass lesion. No dilatation of the main duct. No intraparenchymal cyst. No peripancreatic edema. Spleen:  No splenomegaly. No focal mass lesion. Adrenals/Urinary Tract: No adrenal nodule or mass. Right kidney unremarkable. Left kidney is atrophic. Stomach/Bowel: Stomach is nondistended. No gastric wall thickening. No evidence of outlet obstruction. Duodenum is normally positioned as is the ligament of Treitz. No small bowel or colonic dilatation within the visualized  abdomen. Vascular/Lymphatic: No abdominal aortic aneurysm. No abdominal aortic atherosclerotic calcification. There is no gastrohepatic or hepatoduodenal ligament lymphadenopathy. No intraperitoneal or retroperitoneal lymphadenopathy. Other:  No intraperitoneal free fluid. Musculoskeletal: No  abnormal marrow enhancement within the visualized bony anatomy. IMPRESSION: 1. Gallbladder is decompressed with diffuse gallbladder wall thickening. Mural hyperenhancement and the presence of numerous intramural cystic foci is characteristic for diffuse adenomyomatosis. No definite stones are evident although assessment limited by the contracted state of the gallbladder. No intrahepatic biliary duct dilatation . Extrahepatic bile duct upper normal for size. No evidence for choledocholithiasis Electronically Signed   By: Kennith Center M.D.   On: 08/31/2018 19:03   US Abdomen Limited Ruq  Result Date: 08/31/2018 CLINICAL DATA:  Epigastric pain for 3 days. EXAM: ULTRASOUND ABDOMEN LIMITED RIGHT UPPER QUADRANT COMPARISON:  None. FINDINGS: Gallbladder: The gallbladder is incompletely distended on this exam which limits evaluation. Multiple calcified gallstones are noted. Gallbladder wall thickening is likely due to underdistention. No sonographic Murphy sign noted by sonographer. Common bile duct: Diameter: 3 mm, within normal limits. Liver: No focal lesion identified. Within normal limits in parenchymal echogenicity. Portal vein is patent on color Doppler imaging with normal direction of blood flow towards the liver. IMPRESSION: Suboptimal visualization of gallbladder due to underdistention. Cholelithiasis noted, but no definite definite signs of acute cholecystitis or biliary ductal dilatation. Electronically Signed   By: Myles Rosenthal M.D.   On: 08/31/2018 13:08      Allergies as of 09/02/2018      Reactions   Oxycodone Nausea Only      Medication List    TAKE these medications   amLODipine 5 MG tablet Commonly known as:  NORVASC Take 5 mg by mouth daily. for high blood pressure   clonazePAM 1 MG tablet Commonly known as:  KLONOPIN Take 0.5-1 mg by mouth 2 (two) times daily as needed.   [employer]S VITAMIN B12 1000 MCG tablet Generic drug:  cyanocobalamin Take 1,000 mcg by mouth daily.    HYDROcodone-acetaminophen 5-325 MG tablet Commonly known as:  NORCO/VICODIN Take 1-2 tablets by mouth every 6 (six) hours as needed for moderate pain.   lamoTRIgine 25 MG tablet Commonly known as:  LAMICTAL Take 25 mg by mouth 2 (two) times daily.   lisinopril 20 MG tablet Commonly known as:  PRINIVIL,ZESTRIL Take 20 mg by mouth daily. for high blood pressure   metoprolol tartrate 25 MG tablet Commonly known as:  LOPRESSOR Take 25 mg by mouth 2 (two) times daily.   nicotine 14 mg/24hr patch Commonly known as:  NICODERM CQ - dosed in mg/24 hours Place 1 patch (14 mg total) onto the skin daily. Start taking on:  09/03/2018   ODORLESS COATED FISH OIL 1000 MG Cpdr Take 1,000 mg by mouth 2 (two) times daily.   rOPINIRole 0.25 MG tablet Commonly known as:  REQUIP Take 1.5 mg by mouth at bedtime.   SPIRIVA HANDIHALER 18 MCG inhalation capsule Generic drug:  tiotropium Take 1 capsule by mouth daily.   venlafaxine XR 75 MG 24 hr capsule Commonly known as:  EFFEXOR-XR Take 75-150 mg by mouth. Take 2 capsules (150mg ) in the morning, and 1 capsule (75mg ) in the evening          Management plans discussed with the patient and she is in agreement. Stable for discharge home  Patient should follow up with surgery  CODE STATUS:     Code Status Orders  (From admission, onward)  Start     Ordered   08/31/18 1645  Full code  Continuous     08/31/18 1644        Code Status History    This patient has a current code status but no historical code status.      TOTAL TIME TAKING CARE OF THIS PATIENT: 38 minutes.    Note: This dictation was prepared with Dragon dictation along with smaller phrase technology. Any transcriptional errors that result from this process are unintentional.  Lilyan Prete M.D on 09/02/2018 at 10:58 AM  Between 7am to 6pm - Pager - [phone] After 6pm go to [email or web address] - password [email or web address]  [employer]  [facility]  Office   [phone]  CC: Primary care physician; Patient, No Pcp Per

## 2018-09-03 LAB — SURGICAL PATHOLOGY

## 2018-09-09 DIAGNOSIS — Z9049 Acquired absence of other specified parts of digestive tract: Secondary | ICD-10-CM | POA: Insufficient documentation

## 2020-05-12 DIAGNOSIS — M25579 Pain in unspecified ankle and joints of unspecified foot: Secondary | ICD-10-CM | POA: Insufficient documentation

## 2020-05-23 ENCOUNTER — Other Ambulatory Visit: Payer: Self-pay

## 2020-05-23 ENCOUNTER — Emergency Department
Admission: EM | Admit: 2020-05-23 | Discharge: 2020-05-23 | Disposition: A | Discharge status: Home / Self Care | Payer: Medicaid Other | Attending: Emergency Medicine | Admitting: Emergency Medicine

## 2020-05-23 ENCOUNTER — Emergency Department: Payer: Medicaid Other

## 2020-05-23 ENCOUNTER — Encounter: Payer: Self-pay | Admitting: Emergency Medicine

## 2020-05-23 DIAGNOSIS — M25572 Pain in left ankle and joints of left foot: Secondary | ICD-10-CM | POA: Insufficient documentation

## 2020-05-23 DIAGNOSIS — Z5321 Procedure and treatment not carried out due to patient leaving prior to being seen by health care provider: Secondary | ICD-10-CM | POA: Diagnosis not present

## 2020-05-23 NOTE — ED Triage Notes (Signed)
Presents with left ankle pain   States pain started about 2 weeks ago no injury   No swelling noted on arrival

## 2020-05-23 NOTE — ED Notes (Signed)
Pt not in lobby when called   Informed the screener Bernette Redbird that she had to leave

## 2020-06-08 ENCOUNTER — Other Ambulatory Visit: Payer: Self-pay | Admitting: Physician Assistant

## 2020-06-09 ENCOUNTER — Other Ambulatory Visit: Payer: Self-pay | Admitting: Physician Assistant

## 2020-06-09 DIAGNOSIS — M25572 Pain in left ankle and joints of left foot: Secondary | ICD-10-CM

## 2020-07-06 ENCOUNTER — Ambulatory Visit: Payer: Medicaid Other

## 2020-12-26 DIAGNOSIS — Z131 Encounter for screening for diabetes mellitus: Secondary | ICD-10-CM | POA: Insufficient documentation

## 2021-04-03 ENCOUNTER — Encounter (INDEPENDENT_AMBULATORY_CARE_PROVIDER_SITE_OTHER): Payer: Self-pay | Admitting: Vascular Surgery

## 2021-04-03 ENCOUNTER — Ambulatory Visit (INDEPENDENT_AMBULATORY_CARE_PROVIDER_SITE_OTHER): Payer: Medicaid Other | Admitting: Vascular Surgery

## 2021-04-03 ENCOUNTER — Other Ambulatory Visit: Payer: Self-pay

## 2021-04-03 DIAGNOSIS — E782 Mixed hyperlipidemia: Secondary | ICD-10-CM | POA: Diagnosis not present

## 2021-04-03 DIAGNOSIS — I6529 Occlusion and stenosis of unspecified carotid artery: Secondary | ICD-10-CM | POA: Insufficient documentation

## 2021-04-03 DIAGNOSIS — I6523 Occlusion and stenosis of bilateral carotid arteries: Secondary | ICD-10-CM

## 2021-04-03 DIAGNOSIS — J449 Chronic obstructive pulmonary disease, unspecified: Secondary | ICD-10-CM

## 2021-04-03 DIAGNOSIS — I10 Essential (primary) hypertension: Secondary | ICD-10-CM | POA: Insufficient documentation

## 2021-04-03 DIAGNOSIS — E785 Hyperlipidemia, unspecified: Secondary | ICD-10-CM | POA: Insufficient documentation

## 2021-04-03 NOTE — Progress Notes (Signed)
MRN : [id number]  Danielle Briggs is a 60 y.o. ([birth year]) female who presents with chief complaint of No chief complaint on file. Marland Kitchen  History of Present Illness:   The patient is seen for evaluation of carotid stenosis. The carotid stenosis was identified after duple ultrasound was obtained at [facility].  The patient multiple episodes amaurosis fugax. She notes she has had episodes of "sparkles" There is a recent history of TIA symptoms / focal motor deficits, as she describes weakness of her leg. There is no prior documented CVA.  There is no history of migraine headaches. There is no history of seizures.  The patient is taking enteric-coated aspirin 81 mg daily.  The patient has a history of coronary artery disease, no recent episodes of angina or shortness of breath. The patient denies PAD or claudication symptoms. There is a history of hyperlipidemia which is being treated with a statin.    No outpatient medications have been marked as taking for the 04/03/21 encounter (Appointment) with Gilda Crease, Latina Craver, MD.    Past Medical History:  Diagnosis Date   Coronary artery disease    Hypertension    MI (myocardial infarction) [facility])     Past Surgical History:  Procedure Laterality Date   APPENDECTOMY     CHOLECYSTECTOMY N/A 09/01/2018   Procedure: LAPAROSCOPIC CHOLECYSTECTOMY-NO GRAMS;  Surgeon: Sung Amabile, DO;  Location: [facility] ORS;  Service: General;  Laterality: N/A;   TUBAL LIGATION      Social History Social History   Tobacco Use   Smoking status: Every Day    Pack years: 0.00   Smokeless tobacco: Never  Substance Use Topics   Alcohol use: Yes   Drug use: Yes    Types: Marijuana    Family History No family history of bleeding/clotting disorders, porphyria or autoimmune disease   Allergies  Allergen Reactions   Oxycodone Nausea Only     REVIEW OF SYSTEMS (Negative unless checked)  Constitutional: [] Weight loss  [] Fever  [] Chills Cardiac: [] Chest pain    [] Chest pressure   [] Palpitations   [] Shortness of breath when laying flat   [] Shortness of breath with exertion. Vascular:  [] Pain in legs with walking   [] Pain in legs at rest  [] History of DVT   [] Phlebitis   [] Swelling in legs   [] Varicose veins   [] Non-healing ulcers Pulmonary:   [] Uses home oxygen   [] Productive cough   [] Hemoptysis   [] Wheeze  [] COPD   [] Asthma Neurologic:  [] Dizziness   [] Seizures   [] History of stroke   [x] History of TIA  [] Aphasia   [x] Vissual changes   [] Weakness or numbness in arm   [] Weakness or numbness in leg Musculoskeletal:   [] Joint swelling   [] Joint pain   [] Low back pain Hematologic:  [] Easy bruising  [] Easy bleeding   [] Hypercoagulable state   [] Anemic Gastrointestinal:  [] Diarrhea   [] Vomiting  [] Gastroesophageal reflux/heartburn   [] Difficulty swallowing. Genitourinary:  [] Chronic kidney disease   [] Difficult urination  [] Frequent urination   [] Blood in urine Skin:  [] Rashes   [] Ulcers  Psychological:  [] History of anxiety   []  History of major depression.  Physical Examination  There were no vitals filed for this visit. There is no height or weight on file to calculate BMI. Gen: WD/WN, NAD Head: [place]/AT, No temporalis wasting.  Ear/Nose/Throat: Hearing grossly intact, nares w/o erythema or drainage, poor dentition Eyes: PER, EOMI, sclera nonicteric.  Neck: Supple, no masses.  No bruit or JVD.  Pulmonary:  Good air  movement, clear to auscultation bilaterally, no use of accessory muscles.  Cardiac: RRR, normal S1, S2, no Murmurs. Vascular:  bilateral carotid bruits Vessel Right Left  Radial Palpable Palpable  Carotid Palpable Palpable  Gastrointestinal: soft, non-distended. No guarding/no peritoneal signs.  Musculoskeletal: M/S 5/5 throughout.  No deformity or atrophy.  Neurologic: CN 2-12 intact. Pain and light touch intact in extremities.  Symmetrical.  Speech is fluent. Motor exam as listed above. Psychiatric: Judgment intact, Mood & affect  appropriate for pt's clinical situation. Dermatologic: No rashes or ulcers noted.  No changes consistent with cellulitis. Lymph : No Cervical lymphadenopathy, no lichenification or skin changes of chronic lymphedema.  CBC Lab Results  Component Value Date   WBC 4.3 09/01/2018   HGB 13.0 09/01/2018   HCT 39.4 09/01/2018   MCV 103.4 (H) 09/01/2018   PLT 207 09/01/2018    BMET    Component Value Date/Time   NA 138 09/02/2018 0402   K 3.4 (L) 09/02/2018 0402   CL 109 09/02/2018 0402   CO2 23 09/02/2018 0402   GLUCOSE 130 (H) 09/02/2018 0402   BUN 9 09/02/2018 0402   CREATININE 0.72 09/02/2018 0402   CALCIUM 8.9 09/02/2018 0402   GFRNONAA >60 09/02/2018 0402   GFRAA >60 09/02/2018 0402   CrCl cannot be calculated (Patient's most recent lab result is older than the maximum 21 days allowed.).  COAG No results found for: INR, PROTIME  Radiology No results found.   Assessment/Plan 1. Bilateral carotid artery stenosis The patient remains asymptomatic with respect to the carotid stenosis.  However, the patient has now progressed and has a lesion the is >70%.  Patient should undergo CT angiography of the carotid arteries to define the degree of stenosis of the internal carotid arteries bilaterally and the anatomic suitability for surgery vs. intervention.  If the patient does indeed need surgery cardiac clearance will be required, once cleared the patient will be scheduled for surgery.  The risks, benefits and alternative therapies were reviewed in detail with the patient.  All questions were answered.  The patient agrees to proceed with imaging.  Continue antiplatelet therapy as prescribed. Continue management of CAD, HTN and Hyperlipidemia. Healthy heart diet, encouraged exercise at least 4 times per week.   - CT ANGIO NECK W OR WO CONTRAST; Future  2. Chronic obstructive pulmonary disease, unspecified COPD type (HCC) Continue pulmonary medications and aerosols as already  ordered, these medications have been reviewed and there are no changes at this time.    3. Essential hypertension Continue antihypertensive medications as already ordered, these medications have been reviewed and there are no changes at this time.   4. Mixed hyperlipidemia Continue statin as ordered and reviewed, no changes at this time     Levora Dredge, MD  04/03/2021 11:56 AM

## 2021-04-05 ENCOUNTER — Encounter (INDEPENDENT_AMBULATORY_CARE_PROVIDER_SITE_OTHER): Payer: Self-pay | Admitting: Vascular Surgery

## 2021-04-16 ENCOUNTER — Other Ambulatory Visit: Payer: Self-pay

## 2021-04-16 ENCOUNTER — Encounter: Payer: Self-pay | Admitting: Emergency Medicine

## 2021-04-16 ENCOUNTER — Emergency Department: Payer: Medicaid Other

## 2021-04-16 ENCOUNTER — Emergency Department
Admission: EM | Admit: 2021-04-16 | Discharge: 2021-04-16 | Disposition: A | Discharge status: Home / Self Care | Payer: Medicaid Other | Attending: Emergency Medicine | Admitting: Emergency Medicine

## 2021-04-16 DIAGNOSIS — S92335A Nondisplaced fracture of third metatarsal bone, left foot, initial encounter for closed fracture: Secondary | ICD-10-CM | POA: Diagnosis not present

## 2021-04-16 DIAGNOSIS — M79672 Pain in left foot: Secondary | ICD-10-CM | POA: Diagnosis not present

## 2021-04-16 DIAGNOSIS — W010XXA Fall on same level from slipping, tripping and stumbling without subsequent striking against object, initial encounter: Secondary | ICD-10-CM | POA: Insufficient documentation

## 2021-04-16 DIAGNOSIS — S92302A Fracture of unspecified metatarsal bone(s), left foot, initial encounter for closed fracture: Secondary | ICD-10-CM

## 2021-04-16 DIAGNOSIS — Z79899 Other long term (current) drug therapy: Secondary | ICD-10-CM | POA: Diagnosis not present

## 2021-04-16 DIAGNOSIS — J449 Chronic obstructive pulmonary disease, unspecified: Secondary | ICD-10-CM | POA: Diagnosis not present

## 2021-04-16 DIAGNOSIS — F17200 Nicotine dependence, unspecified, uncomplicated: Secondary | ICD-10-CM | POA: Insufficient documentation

## 2021-04-16 DIAGNOSIS — I2510 Atherosclerotic heart disease of native coronary artery without angina pectoris: Secondary | ICD-10-CM | POA: Diagnosis not present

## 2021-04-16 DIAGNOSIS — S92345A Nondisplaced fracture of fourth metatarsal bone, left foot, initial encounter for closed fracture: Secondary | ICD-10-CM | POA: Insufficient documentation

## 2021-04-16 DIAGNOSIS — I119 Hypertensive heart disease without heart failure: Secondary | ICD-10-CM | POA: Insufficient documentation

## 2021-04-16 DIAGNOSIS — S92325A Nondisplaced fracture of second metatarsal bone, left foot, initial encounter for closed fracture: Secondary | ICD-10-CM | POA: Diagnosis not present

## 2021-04-16 DIAGNOSIS — S99922A Unspecified injury of left foot, initial encounter: Secondary | ICD-10-CM | POA: Diagnosis present

## 2021-04-16 MED ORDER — ONDANSETRON 4 MG PO TBDP: 4.0000 mg | ORAL_TABLET | Freq: Three times a day (TID) | ORAL | 0 refills | Status: AC | PRN

## 2021-04-16 MED ORDER — HYDROCODONE-ACETAMINOPHEN 5-325 MG PO TABS
1.0000 | ORAL_TABLET | Freq: Once | ORAL | Status: AC
  Administered 2021-04-16: 1 via ORAL
  Filled 2021-04-16: qty 1

## 2021-04-16 MED ORDER — HYDROCODONE-ACETAMINOPHEN 5-325 MG PO TABS: 1.0000 | ORAL_TABLET | Freq: Four times a day (QID) | ORAL | 0 refills | Status: DC | PRN

## 2021-04-16 MED ORDER — ONDANSETRON 4 MG PO TBDP
4.0000 mg | ORAL_TABLET | Freq: Once | ORAL | Status: AC
  Administered 2021-04-16: 4 mg via ORAL
  Filled 2021-04-16: qty 1

## 2021-04-16 NOTE — ED Provider Notes (Signed)
Emergency Medicine Provider Triage Evaluation Note  INNA TISDELL , a 60 y.o. female  was evaluated in triage.  Pt complains of left foot and ankle pain/injury after missing 1 step while stepping down.  Review of Systems  Positive: Left foot and ankle pain Negative: No LOC  Physical Exam  There were no vitals taken for this visit. Gen:   Awake, mild distress   Resp:  Normal effort  MSK:   Left foot swelling and bruising Other:    Medical Decision Making  Medically screening exam initiated at 4:31 AM.  Appropriate orders placed.  ALINAH SHEARD was informed that the remainder of the evaluation will be completed by another provider, this initial triage assessment does not replace that evaluation, and the importance of remaining in the ED until their evaluation is complete.  60 year old female who presents status post mechanical fall with left foot/ankle pain/injury.  Will obtain plain film imaging of foot and ankle.  Patient awaiting treatment room.   Irean Hong, MD 04/16/21 [phone]

## 2021-04-16 NOTE — ED Notes (Signed)
See triage note  States she missed a step  Twisted left foot  Swelling and bruising noted to top of foot  Good pulses

## 2021-04-16 NOTE — Discharge Instructions (Addendum)
Follow-up with your regular doctor as needed Follow-up with [facility].  Please call for an appointment on Monday morning.  They will probably see within a week.  Wear the temporary cast until seen by them.  Elevate and ice the foot.  Take your pain medication as needed.  You can also take ibuprofen.  Return if worsening

## 2021-04-16 NOTE — ED Provider Notes (Signed)
[facility] Emergency Department Provider Note  ____________________________________________   Event Date/Time   First MD Initiated Contact with Patient 04/16/21 [phone]     (approximate)  I have reviewed the triage vital signs and the nursing notes.   HISTORY  Chief Complaint Foot Pain    HPI Danielle Briggs is a 60 y.o. female presents emergency department complaining of left foot pain.  Patient states she tripped and fell last night.  States painful to bear weight.  States its throbbing.  No other injuries reported.  Past Medical History:  Diagnosis Date   Coronary artery disease    Hypertension    MI (myocardial infarction) [facility])     Patient Active Problem List   Diagnosis Date Noted   Carotid stenosis 04/03/2021   COPD (chronic obstructive pulmonary disease) (HCC) 04/03/2021   Essential hypertension 04/03/2021   Hyperlipidemia 04/03/2021   Abdominal pain 08/31/2018    Past Surgical History:  Procedure Laterality Date   APPENDECTOMY     CHOLECYSTECTOMY N/A 09/01/2018   Procedure: LAPAROSCOPIC CHOLECYSTECTOMY-NO GRAMS;  Surgeon: Sung Amabile, DO;  Location: [facility] ORS;  Service: General;  Laterality: N/A;   TUBAL LIGATION      Prior to Admission medications   Medication Sig Start Date End Date Taking? Authorizing Provider  HYDROcodone-acetaminophen (NORCO/VICODIN) 5-325 MG tablet Take 1 tablet by mouth every 6 (six) hours as needed for moderate pain. 04/16/21  Yes Jennette Leask, Roselyn Bering, PA-C  ondansetron (ZOFRAN-ODT) 4 MG disintegrating tablet Take 1 tablet (4 mg total) by mouth every 8 (eight) hours as needed. 04/16/21  Yes Willet Schleifer, Roselyn Bering, PA-C  amLODipine (NORVASC) 5 MG tablet Take 5 mg by mouth daily. for high blood pressure 08/26/18   [provider]  clonazePAM (KLONOPIN) 1 MG tablet Take 0.5-1 mg by mouth 2 (two) times daily as needed.  08/26/18   [provider]  [employer] VITAMIN B12 1000 MCG tablet Take 1,000 mcg by mouth daily.  08/26/18   [provider]  escitalopram (LEXAPRO) 20 MG tablet Take 20 mg by mouth daily. 01/24/21   [provider]  lamoTRIgine (LAMICTAL) 25 MG tablet Take 25 mg by mouth 2 (two) times daily. 07/21/18   [provider]  lisinopril (PRINIVIL,ZESTRIL) 20 MG tablet Take 20 mg by mouth daily. for high blood pressure 08/22/18   [provider]  metoprolol tartrate (LOPRESSOR) 25 MG tablet Take 25 mg by mouth 2 (two) times daily. 08/22/18   [provider]  nicotine (NICODERM CQ - DOSED IN MG/24 HOURS) 14 mg/24hr patch Place 1 patch (14 mg total) onto the skin daily. Patient not taking: Reported on 04/03/2021 09/03/18   Adrian Saran, MD  nitroGLYCERIN (NITROSTAT) 0.4 MG SL tablet PLACE 1 TABLET UNDER THE TONGUE EVERY 5 MIN AS NEEDED FOR CHEST PAIN, (MAX 3 DOSES PER EPISODE) 02/02/15   [provider]  Omega-3 Fatty Acids (ODORLESS COATED FISH OIL) 1000 MG CPDR Take 1,000 mg by mouth 2 (two) times daily. 08/26/18   [provider]  PROAIR HFA 108 (90 Base) MCG/ACT inhaler SMARTSIG:2 Puff(s) By Mouth Every 4-6 Hours PRN 12/26/20   [provider]  rOPINIRole (REQUIP) 0.25 MG tablet Take 1.5 mg by mouth at bedtime.  08/22/18   [provider]  SPIRIVA HANDIHALER 18 MCG inhalation capsule Take 1 capsule by mouth daily. 06/28/18   [provider]  zolpidem (AMBIEN) 5 MG tablet Take 5 mg by mouth at bedtime. 03/28/21   [provider]  Allergies Oxycodone  History reviewed. No pertinent family history.  Social History Social History   Tobacco Use   Smoking status: Every Day   Smokeless tobacco: Never  Substance Use Topics   Alcohol use: Yes   Drug use: Yes    Types: Marijuana    Review of Systems  Constitutional: No fever/chills Eyes: No visual changes. ENT: No sore throat. Respiratory: Denies cough Cardiovascular: Denies chest pain Gastrointestinal: Denies abdominal pain Genitourinary: Negative  for dysuria. Musculoskeletal: Negative for back pain.  Positive for left foot pain Skin: Negative for rash. Psychiatric: no mood changes,     ____________________________________________   PHYSICAL EXAM:  VITAL SIGNS: ED Triage Vitals  Enc Vitals Group     BP 04/16/21 0433 (!) 181/85     Pulse Rate 04/16/21 0433 78     Resp 04/16/21 0433 17     Temp 04/16/21 0433 98.7 F (37.1 C)     Temp Source 04/16/21 0433 Oral     SpO2 04/16/21 0433 99 %     Weight 04/16/21 0434 149 lb 0.5 oz (67.6 kg)     Height 04/16/21 0434 4' 11.5" (1.511 m)     Head Circumference --      Peak Flow --      Pain Score 04/16/21 0433 10     Pain Loc --      Pain Edu? --      Excl. in GC? --     Constitutional: Alert and oriented. Well appearing and in no acute distress. Eyes: Conjunctivae are normal.  Head: Atraumatic. Nose: No congestion/rhinnorhea. Mouth/Throat: Mucous membranes are moist.   Neck:  supple no lymphadenopathy noted Cardiovascular: Normal rate, regular rhythm.  Respiratory: Normal respiratory effort.  No retractions,  GU: deferred Musculoskeletal: FROM all extremities, warm and well perfused, left foot is bruised and swollen distally, very tender across the metatarsals distally, neurovascular is intact, skin is intact Neurologic:  Normal speech and language.  Skin:  Skin is warm, dry and intact. No rash noted. Psychiatric: Mood and affect are normal. Speech and behavior are normal.  ____________________________________________   LABS (all labs ordered are listed, but only abnormal results are displayed)  Labs Reviewed - No data to display ____________________________________________   ____________________________________________  RADIOLOGY  X-ray of the left foot and ankle  ____________________________________________   PROCEDURES  Procedure(s) performed:   .Ortho Injury Treatment  Date/Time: 04/16/2021 9:35 AM Performed by: Faythe Ghee, PA-C Authorized  by: Faythe Ghee, PA-C   Consent:    Consent obtained:  Verbal   Consent given by:  Patient   Risks discussed:  Nerve damage, restricted joint movement, vascular damage and stiffness   Alternatives discussed:  Alternative treatmentInjury location: foot Location details: left foot Injury type: fracture Fracture type: second metatarsal, third metatarsal and fourth metatarsal Pre-procedure neurovascular assessment: neurovascularly intact Pre-procedure distal perfusion: normal Pre-procedure neurological function: normal Pre-procedure range of motion: normal  Anesthesia: Local anesthesia used: no  Patient sedated: NoManipulation performed: no Immobilization: splint Splint type: short leg Splint Applied by: ED Tech Supplies used: cotton padding, elastic bandage and Ortho-Glass Post-procedure neurovascular assessment: post-procedure neurovascularly intact Post-procedure distal perfusion: normal Post-procedure neurological function: normal Post-procedure range of motion: normal      ____________________________________________   INITIAL IMPRESSION / ASSESSMENT AND PLAN / ED COURSE  Pertinent labs & imaging results that were available during my care of the patient were reviewed by me and considered in my medical decision making (see chart for details).  Patient is a 60 year old female presents emergency department after a left foot injury.  See HPI.  Physical exam shows patient appears stable.  X-ray of the left foot reviewed by me confirmed by radiology to have 3 fractures of the metatarsals, 2 3 and 4.  Due to the numerous metatarsal fractures, I do feel the patient should be placed in a splint and be nonweightbearing.  She is to follow-up with orthopedics/podiatry.  Call and make an appointment in the morning.  She is given pain medication.  Instructed to return if worsening.  Discharged in stable condition.     Danielle Briggs was evaluated in Emergency Department on  04/16/2021 for the symptoms described in the history of present illness. She was evaluated in the context of the global COVID-19 pandemic, which necessitated consideration that the patient might be at risk for infection with the SARS-CoV-2 virus that causes COVID-19. Institutional protocols and algorithms that pertain to the evaluation of patients at risk for COVID-19 are in a state of rapid change based on information released by regulatory bodies including the CDC and federal and state organizations. These policies and algorithms were followed during the patient's care in the ED.    As part of my medical decision making, I reviewed the following data within the electronic MEDICAL RECORD NUMBER Nursing notes reviewed and incorporated, Old chart reviewed, Radiograph reviewed , Notes from prior ED visits, and [place] Controlled Substance Database  ____________________________________________   FINAL CLINICAL IMPRESSION(S) / ED DIAGNOSES  Final diagnoses:  Multiple closed fractures of metatarsal bone of left foot, initial encounter      NEW MEDICATIONS STARTED DURING THIS VISIT:  New Prescriptions   HYDROCODONE-ACETAMINOPHEN (NORCO/VICODIN) 5-325 MG TABLET    Take 1 tablet by mouth every 6 (six) hours as needed for moderate pain.   ONDANSETRON (ZOFRAN-ODT) 4 MG DISINTEGRATING TABLET    Take 1 tablet (4 mg total) by mouth every 8 (eight) hours as needed.     Note:  This document was prepared using Dragon voice recognition software and may include unintentional dictation errors.    Faythe Ghee, PA-C 04/16/21 5188    Minna Antis, MD 04/16/21 1401

## 2021-04-16 NOTE — ED Triage Notes (Signed)
Pt comes into the ED via POV c/o left foot pain.  Pt states that she missed a step and hit the ground and now she is having foot pain and swelling.  Pt in NAD at this time.

## 2021-04-18 ENCOUNTER — Telehealth (INDEPENDENT_AMBULATORY_CARE_PROVIDER_SITE_OTHER): Payer: Self-pay

## 2021-04-18 NOTE — Telephone Encounter (Signed)
Patient called about having surgery, I advised that I would call her back after finding out what is needed.

## 2021-04-19 ENCOUNTER — Other Ambulatory Visit: Payer: Self-pay

## 2021-04-19 ENCOUNTER — Ambulatory Visit (INDEPENDENT_AMBULATORY_CARE_PROVIDER_SITE_OTHER): Payer: Medicaid Other | Admitting: Podiatry

## 2021-04-19 ENCOUNTER — Encounter: Payer: Self-pay | Admitting: Podiatry

## 2021-04-19 DIAGNOSIS — S92335A Nondisplaced fracture of third metatarsal bone, left foot, initial encounter for closed fracture: Secondary | ICD-10-CM | POA: Diagnosis not present

## 2021-04-19 DIAGNOSIS — S92345A Nondisplaced fracture of fourth metatarsal bone, left foot, initial encounter for closed fracture: Secondary | ICD-10-CM

## 2021-04-19 DIAGNOSIS — S92325A Nondisplaced fracture of second metatarsal bone, left foot, initial encounter for closed fracture: Secondary | ICD-10-CM | POA: Diagnosis not present

## 2021-04-19 NOTE — Progress Notes (Signed)
  Subjective:  Patient ID: Danielle Briggs, female    DOB: 1960-12-13,  MRN: [id number]  No chief complaint on file.   60 y.o. female presents with the above complaint. History confirmed with patient.  She missed a step on Saturday and slipped and fell and she had x-rays taken at the ER  Objective:  Physical Exam: warm, good capillary refill, no trophic changes or ulcerative lesions, normal DP and PT pulses, and normal sensory exam. Left Foot: Swelling edema and ecchymosis over the dorsal forefoot with pain on palpation of the metatarsals   Radiographs: Multiple views x-ray of the left foot taken in the ER reviewed and there are minimally displaced metatarsal fractures 234 Assessment:   1. Closed nondisplaced fracture of second metatarsal bone of left foot, initial encounter   2. Closed nondisplaced fracture of third metatarsal bone of left foot, initial encounter   3. Closed nondisplaced fracture of fourth metatarsal bone of left foot, initial encounter      Plan:  Patient was evaluated and treated and all questions answered.  Reviewed RICE protocol recommended closed nonoperative treatment.  Wrote her a prescription for [facility].  Follow-up in 6 weeks for reevaluation.  Recommend OTC NSAIDs for pain control.  Return in about 6 weeks (around 05/31/2021) for fracture care follow-up, new x-rays .

## 2021-04-20 ENCOUNTER — Telehealth (INDEPENDENT_AMBULATORY_CARE_PROVIDER_SITE_OTHER): Payer: Self-pay

## 2021-04-20 NOTE — Telephone Encounter (Signed)
Patient left a message on the nurse line asking about upcoming surgery. I made the patient aware that she will need a CT done per last note. Patient will be contacting radiology to schedule her appointment. I informed her after scheduling she will need to contact the office to schedule a follow up with Dr Gilda Crease.

## 2021-04-24 NOTE — Telephone Encounter (Signed)
ATC pt to schedule an appt - CT F/U see gs. VM is full. Unable to leave message. Will try again.

## 2021-05-04 ENCOUNTER — Ambulatory Visit: Admission: RE | Admit: 2021-05-04 | Payer: Medicaid Other | Source: Ambulatory Visit

## 2021-05-05 DIAGNOSIS — I6523 Occlusion and stenosis of bilateral carotid arteries: Secondary | ICD-10-CM | POA: Insufficient documentation

## 2021-05-05 DIAGNOSIS — E669 Obesity, unspecified: Secondary | ICD-10-CM | POA: Insufficient documentation

## 2021-05-05 DIAGNOSIS — I25118 Atherosclerotic heart disease of native coronary artery with other forms of angina pectoris: Secondary | ICD-10-CM | POA: Insufficient documentation

## 2021-05-05 DIAGNOSIS — Z955 Presence of coronary angioplasty implant and graft: Secondary | ICD-10-CM | POA: Insufficient documentation

## 2021-05-05 DIAGNOSIS — E66811 Obesity, class 1: Secondary | ICD-10-CM | POA: Insufficient documentation

## 2021-05-07 NOTE — Progress Notes (Deleted)
MRN : [id number]  Danielle Briggs is a 60 y.o. (27-Jan-1961) female who presents with chief complaint of carotid blockage.  History of Present Illness: The patient is seen for follow up evaluation of carotid stenosis. The carotid stenosis followed by ultrasound.   The patient denies amaurosis fugax. There is no recent history of TIA symptoms or focal motor deficits. There is no prior documented CVA.  The patient is taking enteric-coated aspirin 81 mg daily.  There is no history of migraine headaches. There is no history of seizures.  The patient has a history of coronary artery disease, no recent episodes of angina or shortness of breath. The patient denies PAD or claudication symptoms. There is a history of hyperlipidemia which is being treated with a statin.    Carotid Duplex done today shows ***.  No change compared to last study in ***   No outpatient medications have been marked as taking for the 05/08/21 encounter (Appointment) with Gilda Crease, Latina Craver, MD.    Past Medical History:  Diagnosis Date   Coronary artery disease    Hypertension    MI (myocardial infarction) [facility])     Past Surgical History:  Procedure Laterality Date   APPENDECTOMY     CHOLECYSTECTOMY N/A 09/01/2018   Procedure: LAPAROSCOPIC CHOLECYSTECTOMY-NO GRAMS;  Surgeon: Sung Amabile, DO;  Location: [facility] ORS;  Service: General;  Laterality: N/A;   TUBAL LIGATION      Social History Social History   Tobacco Use   Smoking status: Every Day   Smokeless tobacco: Never  Substance Use Topics   Alcohol use: Yes   Drug use: Yes    Types: Marijuana    Family History No family history on file.  Allergies  Allergen Reactions   Oxycodone Nausea Only     REVIEW OF SYSTEMS (Negative unless checked)  Constitutional: [] Weight loss  [] Fever  [] Chills Cardiac: [] Chest pain   [] Chest pressure   [] Palpitations   [] Shortness of breath when laying flat   [] Shortness of breath with exertion. Vascular:   [] Pain in legs with walking   [] Pain in legs at rest  [] History of DVT   [] Phlebitis   [] Swelling in legs   [] Varicose veins   [] Non-healing ulcers Pulmonary:   [] Uses home oxygen   [] Productive cough   [] Hemoptysis   [] Wheeze  [] COPD   [] Asthma Neurologic:  [] Dizziness   [] Seizures   [] History of stroke   [] History of TIA  [] Aphasia   [] Vissual changes   [] Weakness or numbness in arm   [] Weakness or numbness in leg Musculoskeletal:   [] Joint swelling   [] Joint pain   [] Low back pain Hematologic:  [] Easy bruising  [] Easy bleeding   [] Hypercoagulable state   [] Anemic Gastrointestinal:  [] Diarrhea   [] Vomiting  [] Gastroesophageal reflux/heartburn   [] Difficulty swallowing. Genitourinary:  [] Chronic kidney disease   [] Difficult urination  [] Frequent urination   [] Blood in urine Skin:  [] Rashes   [] Ulcers  Psychological:  [] History of anxiety   []  History of major depression.  Physical Examination  There were no vitals filed for this visit. There is no height or weight on file to calculate BMI. Gen: WD/WN, NAD Head: [place]/AT, No temporalis wasting.  Ear/Nose/Throat: Hearing grossly intact, nares w/o erythema or drainage Eyes: PER, EOMI, sclera nonicteric.  Neck: Supple, no masses.  No bruit or JVD.  Pulmonary:  Good air movement, no audible wheezing, no use of accessory muscles.  Cardiac: RRR, normal S1, S2, no Murmurs. Vascular:  *** Vessel Right  Left  Radial Palpable Palpable  Carotid Palpable Palpable  PT Palpable Palpable  DP Palpable Palpable  Gastrointestinal: soft, non-distended. No guarding/no peritoneal signs.  Musculoskeletal: M/S 5/5 throughout.  No visible deformity.  Neurologic: CN 2-12 intact. Pain and light touch intact in extremities.  Symmetrical.  Speech is fluent. Motor exam as listed above. Psychiatric: Judgment intact, Mood & affect appropriate for pt's clinical situation. Dermatologic: No rashes or ulcers noted.  No changes consistent with cellulitis.   CBC Lab  Results  Component Value Date   WBC 4.3 09/01/2018   HGB 13.0 09/01/2018   HCT 39.4 09/01/2018   MCV 103.4 (H) 09/01/2018   PLT 207 09/01/2018    BMET    Component Value Date/Time   NA 138 09/02/2018 0402   K 3.4 (L) 09/02/2018 0402   CL 109 09/02/2018 0402   CO2 23 09/02/2018 0402   GLUCOSE 130 (H) 09/02/2018 0402   BUN 9 09/02/2018 0402   CREATININE 0.72 09/02/2018 0402   CALCIUM 8.9 09/02/2018 0402   GFRNONAA >60 09/02/2018 0402   GFRAA >60 09/02/2018 0402   CrCl cannot be calculated (Patient's most recent lab result is older than the maximum 21 days allowed.).  COAG No results found for: INR, PROTIME  Radiology DG Ankle Complete Left  Result Date: 04/16/2021 CLINICAL DATA:  Fall with pain. EXAM: LEFT ANKLE COMPLETE - 3+ VIEW COMPARISON:  None. FINDINGS: Transverse fractures involving the necks of the second, third, and fourth metatarsals again noted. No evidence for fracture or subluxation at the ankle. No worrisome lytic or sclerotic osseous abnormality. IMPRESSION: Transverse fractures of the second, third, and fourth metatarsals. No evidence for fracture or subluxation at the ankle. Electronically Signed   By: Kennith Center M.D.   On: 04/16/2021 05:21   DG Foot Complete Left  Result Date: 04/16/2021 CLINICAL DATA:  Foot pain after a fall. EXAM: LEFT FOOT - COMPLETE 3+ VIEW COMPARISON:  None. FINDINGS: Transverse fractures are identified in the necks of the second, third, and fourth metatarsals, apparently with some minimal impaction at all 3 fracture sites. No other acute bony abnormality identified. Adjacent tiny radio dense foreign bodies are identified in or on the soft tissues between the second and third toes. IMPRESSION: Transverse fractures involving the neck of the second, third, and fourth metatarsals. Electronically Signed   By: Kennith Center M.D.   On: 04/16/2021 05:20     Assessment/Plan There are no diagnoses linked to this encounter.   Levora Dredge,  MD  05/07/2021 3:46 PM

## 2021-05-08 ENCOUNTER — Ambulatory Visit (INDEPENDENT_AMBULATORY_CARE_PROVIDER_SITE_OTHER): Payer: Medicaid Other | Admitting: Vascular Surgery

## 2021-05-08 DIAGNOSIS — E782 Mixed hyperlipidemia: Secondary | ICD-10-CM

## 2021-05-08 DIAGNOSIS — J449 Chronic obstructive pulmonary disease, unspecified: Secondary | ICD-10-CM

## 2021-05-08 DIAGNOSIS — I6523 Occlusion and stenosis of bilateral carotid arteries: Secondary | ICD-10-CM

## 2021-05-08 DIAGNOSIS — I10 Essential (primary) hypertension: Secondary | ICD-10-CM

## 2021-05-10 ENCOUNTER — Encounter (INDEPENDENT_AMBULATORY_CARE_PROVIDER_SITE_OTHER): Payer: Self-pay | Admitting: Vascular Surgery

## 2021-05-31 ENCOUNTER — Ambulatory Visit: Payer: Medicaid Other | Admitting: Podiatry

## 2021-06-06 ENCOUNTER — Ambulatory Visit (INDEPENDENT_AMBULATORY_CARE_PROVIDER_SITE_OTHER): Payer: Medicaid Other

## 2021-06-06 ENCOUNTER — Ambulatory Visit: Payer: Medicaid Other | Admitting: Podiatry

## 2021-06-06 ENCOUNTER — Other Ambulatory Visit: Payer: Self-pay

## 2021-06-06 DIAGNOSIS — S92325A Nondisplaced fracture of second metatarsal bone, left foot, initial encounter for closed fracture: Secondary | ICD-10-CM

## 2021-06-06 MED ORDER — ONDANSETRON HCL 4 MG PO TABS: 4.0000 mg | ORAL_TABLET | Freq: Three times a day (TID) | ORAL | 0 refills | Status: DC | PRN

## 2021-06-06 MED ORDER — HYDROCODONE-ACETAMINOPHEN 5-325 MG PO TABS: 1.0000 | ORAL_TABLET | Freq: Four times a day (QID) | ORAL | 0 refills | Status: DC | PRN

## 2021-06-06 NOTE — Progress Notes (Signed)
   HPI: 60 y.o. female presenting today for follow-up evaluation of metatarsal fractures 2, 3, 4 left foot.  Patient states that it does feel much better.  She has been weightbearing in the cam boot since last visit.  Overall improvement however she continues to have some pain and tenderness with ambulation.  She presents for further treatment and evaluation  Past Medical History:  Diagnosis Date   Coronary artery disease    Hypertension    MI (myocardial infarction) [facility])      Physical Exam: General: The patient is alert and oriented x3 in no acute distress.  Dermatology: Skin is warm, dry and supple bilateral lower extremities. Negative for open lesions or macerations.  Vascular: Palpable pedal pulses bilaterally.  Most of the edema and ecchymosis has resolved.  Capillary refill within normal limits.  Neurological: Epicritic and protective threshold grossly intact bilaterally.   Musculoskeletal Exam: No pedal deformities noted.  Muscle strength 5/5 all compartments.  There is some residual tenderness to palpation along the forefoot  Radiographic Exam:  Fractures along the metatarsal necks of the second third and fourth left foot with routine healing.  Assessment: 1.  Fractures second third and fourth metatarsals LT   Plan of Care:  1. Patient evaluated. X-Rays reviewed.  2.  Patient may transition out of the cam boot into a postsurgical shoe.  Postoperative shoe dispensed today 3.  Refill prescription for Vicodin 5/325 mg 4.  Prescription for Zofran 4 mg as needed nausea and vomiting 5.  Return to clinic in 6 weeks for follow-up x-ray      Felecia Shelling, DPM [facility]  Dr. Felecia Shelling, DPM    [address] [place], [place] [address]                Office [phone]  Fax [phone]

## 2021-06-27 ENCOUNTER — Telehealth (INDEPENDENT_AMBULATORY_CARE_PROVIDER_SITE_OTHER): Payer: Self-pay | Admitting: Vascular Surgery

## 2021-06-27 ENCOUNTER — Other Ambulatory Visit (INDEPENDENT_AMBULATORY_CARE_PROVIDER_SITE_OTHER): Payer: Self-pay | Admitting: Nurse Practitioner

## 2021-06-27 DIAGNOSIS — I6523 Occlusion and stenosis of bilateral carotid arteries: Secondary | ICD-10-CM

## 2021-06-27 NOTE — Telephone Encounter (Signed)
Called to see what she needs to do in order to have Ct done. I advised patient I would talk to the providers and call her back to inform her. Patient no showed CT and f/u with GS for ct results in August 2022. Please advise.

## 2021-06-27 NOTE — Telephone Encounter (Signed)
I placed the orders for CT angio neck.  It looks like they were placed previously but because she no showed those orders cancelled.  Once she has her CT, she can call us to see Dr. Gilda Briggs and review results

## 2021-06-27 NOTE — Telephone Encounter (Signed)
Called to schedule patient & made patient aware of Np's note

## 2021-07-17 ENCOUNTER — Telehealth (INDEPENDENT_AMBULATORY_CARE_PROVIDER_SITE_OTHER): Payer: Self-pay

## 2021-07-17 NOTE — Telephone Encounter (Signed)
Pt called and left a VM on the nurses line an wanted to make [place] aware that she has scheduled her CT with out patient imaging on 07/19/21 at 4:15 PM Please call to schedule the pt's FU in our office.

## 2021-07-18 ENCOUNTER — Other Ambulatory Visit: Payer: Self-pay

## 2021-07-18 ENCOUNTER — Ambulatory Visit (INDEPENDENT_AMBULATORY_CARE_PROVIDER_SITE_OTHER): Payer: Medicaid Other | Admitting: Podiatry

## 2021-07-18 ENCOUNTER — Ambulatory Visit (INDEPENDENT_AMBULATORY_CARE_PROVIDER_SITE_OTHER): Payer: Medicaid Other

## 2021-07-18 DIAGNOSIS — S92325A Nondisplaced fracture of second metatarsal bone, left foot, initial encounter for closed fracture: Secondary | ICD-10-CM

## 2021-07-18 MED ORDER — ONDANSETRON HCL 4 MG PO TABS: 4.0000 mg | ORAL_TABLET | Freq: Three times a day (TID) | ORAL | 0 refills | Status: AC | PRN

## 2021-07-18 MED ORDER — HYDROCODONE-ACETAMINOPHEN 5-325 MG PO TABS: 1.0000 | ORAL_TABLET | Freq: Four times a day (QID) | ORAL | 0 refills | Status: AC | PRN

## 2021-07-18 NOTE — Progress Notes (Signed)
   HPI: 60 y.o. female presenting today for follow-up evaluation of metatarsal fractures 2, 3, 4 left foot.  Patient states that it does feel much better.  Patient states that she only has some intermittent pain on occasion.  She has been weightbearing in the postsurgical shoe as instructed.  No new complaints at this time  Past Medical History:  Diagnosis Date   Coronary artery disease    Hypertension    MI (myocardial infarction) [facility])      Physical Exam: General: The patient is alert and oriented x3 in no acute distress.  Dermatology: Skin is warm, dry and supple bilateral lower extremities. Negative for open lesions or macerations.  Vascular: Palpable pedal pulses bilaterally.  Most of the edema and ecchymosis has resolved.  Capillary refill within normal limits.  Neurological: Epicritic and protective threshold grossly intact bilaterally.   Musculoskeletal Exam: No pedal deformities noted.  Muscle strength 5/5 all compartments.  Negative for any significant tenderness to palpation along the forefoot  Radiographic Exam:  Fractures along the metatarsal necks of the second third and fourth left foot with routine healing.  Assessment: 1.  Fractures second third and fourth metatarsals LT; with routine healing   Plan of Care:  1. Patient evaluated. X-Rays reviewed.  2.  Patient may now transition out of the postsurgical shoe into good supportive sneakers 3.  Refill prescription for Vicodin 5/3 2 5  mg 4.  Refill prescription for Zofran 4 mg as needed nausea and vomiting 5.  Patient may slowly increase her activity full activity no restrictions 6.  Return to clinic as needed      Felecia Shelling, DPM [facility]  Dr. Felecia Shelling, DPM    [address] [place], [place] [address]                Office [phone]  Fax [phone]

## 2021-07-19 ENCOUNTER — Ambulatory Visit: Admission: RE | Admit: 2021-07-19 | Payer: Medicaid Other | Source: Ambulatory Visit

## 2021-07-26 ENCOUNTER — Other Ambulatory Visit (INDEPENDENT_AMBULATORY_CARE_PROVIDER_SITE_OTHER): Payer: Self-pay | Admitting: Nurse Practitioner

## 2021-08-03 ENCOUNTER — Telehealth (INDEPENDENT_AMBULATORY_CARE_PROVIDER_SITE_OTHER): Payer: Self-pay | Admitting: Vascular Surgery

## 2021-08-03 NOTE — Telephone Encounter (Signed)
Documentation only.

## 2021-09-11 ENCOUNTER — Other Ambulatory Visit
Admission: RE | Admit: 2021-09-11 | Discharge: 2021-09-11 | Disposition: A | Discharge status: Home / Self Care | Payer: Medicaid Other | Source: Ambulatory Visit | Attending: Internal Medicine | Admitting: Internal Medicine

## 2021-09-11 DIAGNOSIS — E785 Hyperlipidemia, unspecified: Secondary | ICD-10-CM | POA: Insufficient documentation

## 2021-09-11 DIAGNOSIS — I10 Essential (primary) hypertension: Secondary | ICD-10-CM | POA: Diagnosis present

## 2021-09-11 DIAGNOSIS — Z79899 Other long term (current) drug therapy: Secondary | ICD-10-CM | POA: Diagnosis present

## 2021-09-11 DIAGNOSIS — I25118 Atherosclerotic heart disease of native coronary artery with other forms of angina pectoris: Secondary | ICD-10-CM | POA: Diagnosis present

## 2021-09-11 DIAGNOSIS — Z01818 Encounter for other preprocedural examination: Secondary | ICD-10-CM | POA: Insufficient documentation

## 2021-09-11 DIAGNOSIS — Z955 Presence of coronary angioplasty implant and graft: Secondary | ICD-10-CM | POA: Diagnosis present

## 2021-09-11 LAB — BRAIN NATRIURETIC PEPTIDE: B Natriuretic Peptide: 20.5 pg/mL (ref 0.0–100.0)

## 2021-09-19 ENCOUNTER — Ambulatory Visit
Admission: RE | Admit: 2021-09-19 | Discharge: 2021-09-19 | Disposition: A | Discharge status: Home / Self Care | Payer: Medicaid Other | Source: Ambulatory Visit | Attending: Nurse Practitioner | Admitting: Nurse Practitioner

## 2021-09-19 ENCOUNTER — Other Ambulatory Visit: Payer: Self-pay

## 2021-09-19 DIAGNOSIS — I6523 Occlusion and stenosis of bilateral carotid arteries: Secondary | ICD-10-CM | POA: Diagnosis present

## 2021-09-19 MED ORDER — IOHEXOL 350 MG/ML SOLN
75.0000 mL | Freq: Once | INTRAVENOUS | Status: AC | PRN
  Administered 2021-09-19: 08:00:00 75 mL via INTRAVENOUS

## 2021-09-26 ENCOUNTER — Telehealth (INDEPENDENT_AMBULATORY_CARE_PROVIDER_SITE_OTHER): Payer: Self-pay

## 2021-09-26 NOTE — Telephone Encounter (Signed)
Attempted to contact patient at [phone] and [phone] to schedule an appointment with Dr.Schnier.

## 2021-09-28 ENCOUNTER — Encounter
Admission: RE | Disposition: A | Discharge status: Home / Self Care | Payer: Self-pay | Source: Ambulatory Visit | Attending: Internal Medicine

## 2021-09-28 ENCOUNTER — Encounter: Payer: Self-pay | Admitting: Internal Medicine

## 2021-09-28 ENCOUNTER — Ambulatory Visit
Admission: RE | Admit: 2021-09-28 | Discharge: 2021-09-28 | Disposition: A | Discharge status: Home / Self Care | Payer: Medicaid Other | Source: Ambulatory Visit | Attending: Internal Medicine | Admitting: Internal Medicine

## 2021-09-28 ENCOUNTER — Other Ambulatory Visit: Payer: Self-pay

## 2021-09-28 DIAGNOSIS — I2510 Atherosclerotic heart disease of native coronary artery without angina pectoris: Secondary | ICD-10-CM | POA: Diagnosis not present

## 2021-09-28 DIAGNOSIS — I350 Nonrheumatic aortic (valve) stenosis: Secondary | ICD-10-CM | POA: Insufficient documentation

## 2021-09-28 DIAGNOSIS — Z955 Presence of coronary angioplasty implant and graft: Secondary | ICD-10-CM | POA: Diagnosis not present

## 2021-09-28 DIAGNOSIS — I272 Pulmonary hypertension, unspecified: Secondary | ICD-10-CM | POA: Insufficient documentation

## 2021-09-28 HISTORY — DX: Chronic obstructive pulmonary disease, unspecified: J44.9

## 2021-09-28 HISTORY — PX: RIGHT/LEFT HEART CATH AND CORONARY ANGIOGRAPHY: CATH118266

## 2021-09-28 SURGERY — RIGHT/LEFT HEART CATH AND CORONARY ANGIOGRAPHY
Anesthesia: Moderate Sedation | Laterality: Bilateral

## 2021-09-28 MED ORDER — SODIUM CHLORIDE 0.9 % IV SOLN
INTRAVENOUS | Status: DC | PRN
  Administered 2021-09-28: 10 mL/h via INTRAVENOUS

## 2021-09-28 MED ORDER — ACETAMINOPHEN 325 MG PO TABS: 650.0000 mg | ORAL_TABLET | ORAL | Status: DC | PRN

## 2021-09-28 MED ORDER — LIDOCAINE HCL (PF) 1 % IJ SOLN
INTRAMUSCULAR | Status: DC | PRN
  Administered 2021-09-28: 20 mL

## 2021-09-28 MED ORDER — ASPIRIN 81 MG PO CHEW
CHEWABLE_TABLET | ORAL | Status: AC
  Filled 2021-09-28: qty 1

## 2021-09-28 MED ORDER — ONDANSETRON HCL 4 MG/2ML IJ SOLN
INTRAMUSCULAR | Status: DC | PRN
  Administered 2021-09-28: 4 mg via INTRAVENOUS

## 2021-09-28 MED ORDER — SODIUM CHLORIDE 0.9% FLUSH: 3.0000 mL | Freq: Two times a day (BID) | INTRAVENOUS | Status: DC

## 2021-09-28 MED ORDER — HYDRALAZINE HCL 20 MG/ML IJ SOLN
INTRAMUSCULAR | Status: AC
  Filled 2021-09-28: qty 1

## 2021-09-28 MED ORDER — SODIUM CHLORIDE 0.9 % IV SOLN: 250.0000 mL | INTRAVENOUS | Status: DC | PRN

## 2021-09-28 MED ORDER — ONDANSETRON HCL 4 MG/2ML IJ SOLN
INTRAMUSCULAR | Status: AC
  Filled 2021-09-28: qty 2

## 2021-09-28 MED ORDER — SODIUM CHLORIDE 0.9 % WEIGHT BASED INFUSION: 1.0000 mL/kg/h | INTRAVENOUS | Status: DC

## 2021-09-28 MED ORDER — HYDRALAZINE HCL 20 MG/ML IJ SOLN
INTRAMUSCULAR | Status: DC | PRN
  Administered 2021-09-28 (×2): 20 mg via INTRAVENOUS

## 2021-09-28 MED ORDER — ASPIRIN 81 MG PO CHEW
81.0000 mg | CHEWABLE_TABLET | ORAL | Status: AC
  Administered 2021-09-28: 81 mg via ORAL

## 2021-09-28 MED ORDER — MIDAZOLAM HCL 2 MG/2ML IJ SOLN
INTRAMUSCULAR | Status: AC
  Filled 2021-09-28: qty 2

## 2021-09-28 MED ORDER — ONDANSETRON HCL 4 MG/2ML IJ SOLN: 4.0000 mg | Freq: Four times a day (QID) | INTRAMUSCULAR | Status: DC | PRN

## 2021-09-28 MED ORDER — FENTANYL CITRATE (PF) 100 MCG/2ML IJ SOLN
INTRAMUSCULAR | Status: AC
  Filled 2021-09-28: qty 2

## 2021-09-28 MED ORDER — HEPARIN (PORCINE) IN NACL 1000-0.9 UT/500ML-% IV SOLN
INTRAVENOUS | Status: AC
  Filled 2021-09-28: qty 1000

## 2021-09-28 MED ORDER — MIDAZOLAM HCL 2 MG/2ML IJ SOLN
INTRAMUSCULAR | Status: DC | PRN
  Administered 2021-09-28 (×2): 1 mg via INTRAVENOUS

## 2021-09-28 MED ORDER — SODIUM CHLORIDE 0.9 % WEIGHT BASED INFUSION
3.0000 mL/kg/h | INTRAVENOUS | Status: AC
  Administered 2021-09-28: 3 mL/kg/h via INTRAVENOUS

## 2021-09-28 MED ORDER — LIDOCAINE HCL 1 % IJ SOLN
INTRAMUSCULAR | Status: AC
  Filled 2021-09-28: qty 20

## 2021-09-28 MED ORDER — IOHEXOL 300 MG/ML  SOLN
INTRAMUSCULAR | Status: DC | PRN
  Administered 2021-09-28: 61 mL

## 2021-09-28 MED ORDER — HYDRALAZINE HCL 20 MG/ML IJ SOLN: 10.0000 mg | INTRAMUSCULAR | Status: DC | PRN

## 2021-09-28 MED ORDER — HEPARIN (PORCINE) IN NACL 1000-0.9 UT/500ML-% IV SOLN
INTRAVENOUS | Status: DC | PRN
  Administered 2021-09-28 (×2): 500 mL

## 2021-09-28 MED ORDER — SODIUM CHLORIDE 0.9% FLUSH: 3.0000 mL | INTRAVENOUS | Status: DC | PRN

## 2021-09-28 MED ORDER — LABETALOL HCL 5 MG/ML IV SOLN: 10.0000 mg | INTRAVENOUS | Status: DC | PRN

## 2021-09-28 MED ORDER — FENTANYL CITRATE (PF) 100 MCG/2ML IJ SOLN
INTRAMUSCULAR | Status: DC | PRN
  Administered 2021-09-28 (×3): 25 ug via INTRAVENOUS

## 2021-09-28 MED ORDER — ACETAMINOPHEN 325 MG PO TABS
ORAL_TABLET | ORAL | Status: AC
  Administered 2021-09-28: 650 mg via ORAL
  Filled 2021-09-28: qty 2

## 2021-09-28 SURGICAL SUPPLY — 12 items
CATH INFINITI 5FR MULTPACK ANG (CATHETERS) ×1 IMPLANT
CATH SWAN GANZ 7F STRAIGHT (CATHETERS) ×1 IMPLANT
DEVICE CLOSURE MYNXGRIP 5F (Vascular Products) ×1 IMPLANT
NDL PERC 18GX7CM (NEEDLE) IMPLANT
NEEDLE PERC 18GX7CM (NEEDLE) ×2 IMPLANT
PACK CARDIAC CATH (CUSTOM PROCEDURE TRAY) ×3 IMPLANT
PROTECTION STATION PRESSURIZED (MISCELLANEOUS) ×2
SET ATX SIMPLICITY (MISCELLANEOUS) ×1 IMPLANT
SHEATH AVANTI 5FR X 11CM (SHEATH) ×1 IMPLANT
SHEATH AVANTI 7FRX11 (SHEATH) ×1 IMPLANT
STATION PROTECTION PRESSURIZED (MISCELLANEOUS) IMPLANT
WIRE GUIDERIGHT .035X150 (WIRE) ×1 IMPLANT

## 2021-09-28 NOTE — Progress Notes (Signed)
Pt. Med with 2 plain tylenol 650 mg p.o. for c/o bilat. Shoulder tension.

## 2021-09-30 LAB — CARDIAC CATHETERIZATION: Cath EF Quantitative: 60 %

## 2021-10-02 ENCOUNTER — Encounter: Payer: Self-pay | Admitting: Internal Medicine

## 2021-10-05 ENCOUNTER — Encounter (INDEPENDENT_AMBULATORY_CARE_PROVIDER_SITE_OTHER): Payer: Self-pay | Admitting: Vascular Surgery

## 2021-10-05 ENCOUNTER — Ambulatory Visit (INDEPENDENT_AMBULATORY_CARE_PROVIDER_SITE_OTHER): Payer: Medicaid Other | Admitting: Vascular Surgery

## 2021-10-05 ENCOUNTER — Other Ambulatory Visit: Payer: Self-pay

## 2021-10-05 VITALS — BP 164/81 | HR 71 | Resp 17 | Ht <= 58 in | Wt 156.0 lb

## 2021-10-05 DIAGNOSIS — E782 Mixed hyperlipidemia: Secondary | ICD-10-CM

## 2021-10-05 DIAGNOSIS — I10 Essential (primary) hypertension: Secondary | ICD-10-CM

## 2021-10-05 DIAGNOSIS — J449 Chronic obstructive pulmonary disease, unspecified: Secondary | ICD-10-CM

## 2021-10-05 DIAGNOSIS — I6523 Occlusion and stenosis of bilateral carotid arteries: Secondary | ICD-10-CM

## 2021-10-05 NOTE — Progress Notes (Signed)
MRN : [id number]  Danielle Briggs is a 61 y.o. (Apr 25, 1961) female who presents with chief complaint of carotid stenosis.  History of Present Illness:   The patient is seen for follow up evaluation of carotid stenosis status post CT angiogram. CT scan was done 09/19/2021. Patient reports that the test went well with no problems or complications.   The patient multiple episodes amaurosis fugax. She notes she has had episodes of "sparkles" There is a recent history of TIA symptoms / focal motor deficits, as she describes weakness of her leg. There is no recent or interval TIA symptoms or focal motor deficits. There is no prior documented CVA.  The patient is taking enteric-coated aspirin 81 mg daily.  There is no history of migraine headaches. There is no history of seizures.  The patient has a history of coronary artery disease, she has multiple episodes of angina as a baseline.  No recent episodes of  shortness of breath.  She is status post coronary stent placement x3 The patient denies PAD or claudication symptoms. There is a history of hyperlipidemia which is being treated with a statin.    CT angiogram is reviewed by me personally and shows >90% stenosis consistent with calcified plaque at the origin of the right internal carotid artery.    No outpatient medications have been marked as taking for the 10/05/21 encounter (Appointment) with Gilda Crease, Latina Craver, MD.    Past Medical History:  Diagnosis Date   COPD (chronic obstructive pulmonary disease) (HCC)    Coronary artery disease    Hypertension    MI (myocardial infarction) [facility])     Past Surgical History:  Procedure Laterality Date   APPENDECTOMY     CHOLECYSTECTOMY N/A 09/01/2018   Procedure: LAPAROSCOPIC CHOLECYSTECTOMY-NO GRAMS;  Surgeon: Sung Amabile, DO;  Location: [facility] ORS;  Service: General;  Laterality: N/A;   RIGHT/LEFT HEART CATH AND CORONARY ANGIOGRAPHY Bilateral 09/28/2021   Procedure: RIGHT/LEFT HEART CATH  AND CORONARY ANGIOGRAPHY;  Surgeon: Alwyn Pea, MD;  Location: [facility] IN[facility];  Service: Cardiovascular;  Laterality: Bilateral;   TUBAL LIGATION      Social History Social History   Tobacco Use   Smoking status: Former    Packs/day: 2.00    Years: 40.00    Pack years: 80.00    Types: Cigarettes    Quit date: 2013    Years since quitting: 10.0   Smokeless tobacco: Never  Substance Use Topics   Alcohol use: Not Currently   Drug use: Yes    Types: Marijuana    Comment: 3 days ago    Family History No family history on file.  Allergies  Allergen Reactions   Oxycodone Nausea Only     REVIEW OF SYSTEMS (Negative unless checked)  Constitutional: [] Weight loss  [] Fever  [] Chills Cardiac: [] Chest pain   [] Chest pressure   [] Palpitations   [] Shortness of breath when laying flat   [] Shortness of breath with exertion. Vascular:  [] Pain in legs with walking   [] Pain in legs at rest  [] History of DVT   [] Phlebitis   [] Swelling in legs   [] Varicose veins   [] Non-healing ulcers Pulmonary:   [] Uses home oxygen   [] Productive cough   [] Hemoptysis   [] Wheeze  [] COPD   [] Asthma Neurologic:  [] Dizziness   [] Seizures   [] History of stroke   [x] History of TIA  [] Aphasia   [x] Vissual changes   [] Weakness or numbness in arm   [] Weakness or numbness in leg  Musculoskeletal:   [] Joint swelling   [] Joint pain   [] Low back pain Hematologic:  [] Easy bruising  [] Easy bleeding   [] Hypercoagulable state   [] Anemic Gastrointestinal:  [] Diarrhea   [] Vomiting  [] Gastroesophageal reflux/heartburn   [] Difficulty swallowing. Genitourinary:  [] Chronic kidney disease   [] Difficult urination  [] Frequent urination   [] Blood in urine Skin:  [] Rashes   [] Ulcers  Psychological:  [] History of anxiety   []  History of major depression.  Physical Examination  There were no vitals filed for this visit. There is no height or weight on file to calculate BMI. Gen: WD/WN, NAD Head: [place]/AT, No temporalis  wasting.  Ear/Nose/Throat: Hearing grossly intact, nares w/o erythema or drainage Eyes: PER, EOMI, sclera nonicteric.  Neck: Supple, no masses.  No bruit or JVD.  Pulmonary:  Good air movement, no audible wheezing, no use of accessory muscles.  Cardiac: RRR, normal S1, S2, no Murmurs. Vascular: Bilateral carotid bruits Vessel Right Left  Radial Palpable Palpable  Carotid Palpable Palpable  Gastrointestinal: soft, non-distended. No guarding/no peritoneal signs.  Musculoskeletal: M/S 5/5 throughout.  No visible deformity.  Neurologic: CN 2-12 intact. Pain and light touch intact in extremities.  Symmetrical.  Speech is fluent. Motor exam as listed above. Psychiatric: Judgment intact, Mood & affect appropriate for pt's clinical situation. Dermatologic: No rashes or ulcers noted.  No changes consistent with cellulitis.   CBC Lab Results  Component Value Date   WBC 4.3 09/01/2018   HGB 13.0 09/01/2018   HCT 39.4 09/01/2018   MCV 103.4 (H) 09/01/2018   PLT 207 09/01/2018    BMET    Component Value Date/Time   NA 138 09/02/2018 0402   K 3.4 (L) 09/02/2018 0402   CL 109 09/02/2018 0402   CO2 23 09/02/2018 0402   GLUCOSE 130 (H) 09/02/2018 0402   BUN 9 09/02/2018 0402   CREATININE 0.72 09/02/2018 0402   CALCIUM 8.9 09/02/2018 0402   GFRNONAA >60 09/02/2018 0402   GFRAA >60 09/02/2018 0402   CrCl cannot be calculated (Patient's most recent lab result is older than the maximum 21 days allowed.).  COAG No results found for: INR, PROTIME  Radiology CT ANGIO NECK W OR WO CONTRAST  Result Date: 09/19/2021 CLINICAL DATA:  Carotid artery stenosis. Dizziness and visual disturbances. EXAM: CT ANGIOGRAPHY NECK TECHNIQUE: Multidetector CT imaging of the neck was performed using the standard protocol during bolus administration of intravenous contrast. Multiplanar CT image reconstructions and MIPs were obtained to evaluate the vascular anatomy. Carotid stenosis measurements (when  applicable) are obtained utilizing NASCET criteria, using the distal internal carotid diameter as the denominator. CONTRAST:  59mL OMNIPAQUE IOHEXOL 350 MG/ML SOLN COMPARISON:  Carotid Doppler ultrasound 03/13/2021 FINDINGS: Aortic arch: Standard 3 vessel aortic arch with mild atherosclerotic plaque. Atherosclerotic plaque in the brachiocephalic and subclavian arteries without flow limiting stenosis. Right carotid system: Patent with predominantly soft plaque in the carotid bulb resulting in approximately 80% ICA stenosis at the distal aspect of the bulb. Tortuous distal cervical ICA. Left carotid system: Patent with mixed calcified and soft plaque in the proximal common carotid artery resulting in 70% stenosis. Soft and calcified plaque in the proximal ICA resulting in luminal irregularity but no stenosis of 50% or greater. Tortuous mid cervical ICA. Vertebral arteries: The vertebral arteries are patent without evidence of a significant stenosis on the right. There is a small curvilinear filling defect in the left subclavian artery at the origin of the left vertebral artery which could reflect a focal dissection flap,  and this results in severe stenosis of the left vertebral origin estimated at 85% or greater. The left vertebral artery is otherwise widely patent throughout the remainder of the neck and is mildly dominant. Skeleton: Asymmetrically advanced left-sided facet arthrosis at C3-4 and C4-5. Other neck: No evidence of cervical lymphadenopathy or mass. Upper chest: Mild centrilobular emphysema. IMPRESSION: 1. 80% proximal right ICA stenosis. 2. 70% proximal left common carotid artery stenosis. 3. 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 4. Aortic Atherosclerosis (ICD10-I70.0) and Emphysema (ICD10-J43.9). Electronically Signed   By: Allen  Grady M.D.   On: 09/19/2021 08:45  ° °CARDIAC CATHETERIZATION ° °Result Date: 09/30/2021 °  Previously  placed Mid LAD stent (unknown type) is  widely patent.   The left ventricular systolic function is normal.   LV end diastolic pressure is moderately elevated.   The left ventricular ejection fraction is 55-65% by visual estimate.   There is severe aortic valve stenosis.  Recorded around 0.5 cm 2   Mild pulmonary hypertension elevated PA of mean 27   Widely patent mid LAD stent   No significant obstructive coronary artery disease   Recommend referral for possible evaluation for TAVR Conclusion Mild pulm hypertension PA pressures of 27 main mean wedge of 18 Normal left ventricular function around 60% Left heart cath Left main minor irregularities LAD large minor irregularities widely patent mid stent Circumflex large minor irregularities left dominant RCA small nondominant Peak to peak gradient across that aortic valve is between 30 and 40 Estimated valve area was calculated to be 0.5 cm² Suggestive of moderate to severe aortic stenosis Recommend referral to tertiary care center for possible TAVR evaluation   ° ° °Assessment/Plan °1. Bilateral carotid artery stenosis °Recommend: ° °The patient is symptomatic with respect to the carotid stenosis.  The patient now has progressed and has a lesion the is >70%. ° °Patient's CT angiography of the carotid arteries confirms >70% right ICA stenosis.  The anatomical considerations support stenting over surgery.  This was discussed in detail with the patient. ° °The risks, benefits and alternative therapies were reviewed in detail with the patient.  All questions were answered.  The patient agrees to proceed with stenting of the right carotid artery. ° °Continue antiplatelet therapy as prescribed. °Continue management of CAD, HTN and Hyperlipidemia. °Healthy heart diet, encouraged exercise at least 4 times per week.   ° °2. Essential hypertension °Continue antihypertensive medications as already ordered, these medications have been reviewed and there are no changes at this time.   ° °3. Chronic obstructive pulmonary disease, unspecified COPD type (HCC) °Continue pulmonary medications and aerosols as already ordered, these medications have been reviewed and there are no changes at this time. °  ° °4. Mixed hyperlipidemia °Continue statin as ordered and reviewed, no changes at this time  ° ° ° °Monquie Fulgham, MD ° °10/05/2021 °1:14 PM ° °  °

## 2021-10-05 NOTE — H&P (View-Only) (Signed)
MRN : [id number]  Danielle Briggs is a 61 y.o. (Apr 25, 1961) female who presents with chief complaint of carotid stenosis.  History of Present Illness:   The patient is seen for follow up evaluation of carotid stenosis status post CT angiogram. CT scan was done 09/19/2021. Patient reports that the test went well with no problems or complications.   The patient multiple episodes amaurosis fugax. She notes she has had episodes of "sparkles" There is a recent history of TIA symptoms / focal motor deficits, as she describes weakness of her leg. There is no recent or interval TIA symptoms or focal motor deficits. There is no prior documented CVA.  The patient is taking enteric-coated aspirin 81 mg daily.  There is no history of migraine headaches. There is no history of seizures.  The patient has a history of coronary artery disease, she has multiple episodes of angina as a baseline.  No recent episodes of  shortness of breath.  She is status post coronary stent placement x3 The patient denies PAD or claudication symptoms. There is a history of hyperlipidemia which is being treated with a statin.    CT angiogram is reviewed by me personally and shows >90% stenosis consistent with calcified plaque at the origin of the right internal carotid artery.    No outpatient medications have been marked as taking for the 10/05/21 encounter (Appointment) with Gilda Crease, Latina Craver, MD.    Past Medical History:  Diagnosis Date   COPD (chronic obstructive pulmonary disease) (HCC)    Coronary artery disease    Hypertension    MI (myocardial infarction) [facility])     Past Surgical History:  Procedure Laterality Date   APPENDECTOMY     CHOLECYSTECTOMY N/A 09/01/2018   Procedure: LAPAROSCOPIC CHOLECYSTECTOMY-NO GRAMS;  Surgeon: Sung Amabile, DO;  Location: [facility] ORS;  Service: General;  Laterality: N/A;   RIGHT/LEFT HEART CATH AND CORONARY ANGIOGRAPHY Bilateral 09/28/2021   Procedure: RIGHT/LEFT HEART CATH  AND CORONARY ANGIOGRAPHY;  Surgeon: Alwyn Pea, MD;  Location: [facility];  Service: Cardiovascular;  Laterality: Bilateral;   TUBAL LIGATION      Social History Social History   Tobacco Use   Smoking status: Former    Packs/day: 2.00    Years: 40.00    Pack years: 80.00    Types: Cigarettes    Quit date: 2013    Years since quitting: 10.0   Smokeless tobacco: Never  Substance Use Topics   Alcohol use: Not Currently   Drug use: Yes    Types: Marijuana    Comment: 3 days ago    Family History No family history on file.  Allergies  Allergen Reactions   Oxycodone Nausea Only     REVIEW OF SYSTEMS (Negative unless checked)  Constitutional: [] Weight loss  [] Fever  [] Chills Cardiac: [] Chest pain   [] Chest pressure   [] Palpitations   [] Shortness of breath when laying flat   [] Shortness of breath with exertion. Vascular:  [] Pain in legs with walking   [] Pain in legs at rest  [] History of DVT   [] Phlebitis   [] Swelling in legs   [] Varicose veins   [] Non-healing ulcers Pulmonary:   [] Uses home oxygen   [] Productive cough   [] Hemoptysis   [] Wheeze  [] COPD   [] Asthma Neurologic:  [] Dizziness   [] Seizures   [] History of stroke   [x] History of TIA  [] Aphasia   [x] Vissual changes   [] Weakness or numbness in arm   [] Weakness or numbness in leg  Musculoskeletal:   [] Joint swelling   [] Joint pain   [] Low back pain Hematologic:  [] Easy bruising  [] Easy bleeding   [] Hypercoagulable state   [] Anemic Gastrointestinal:  [] Diarrhea   [] Vomiting  [] Gastroesophageal reflux/heartburn   [] Difficulty swallowing. Genitourinary:  [] Chronic kidney disease   [] Difficult urination  [] Frequent urination   [] Blood in urine Skin:  [] Rashes   [] Ulcers  Psychological:  [] History of anxiety   []  History of major depression.  Physical Examination  There were no vitals filed for this visit. There is no height or weight on file to calculate BMI. Gen: WD/WN, NAD Head: [place]/AT, No temporalis  wasting.  Ear/Nose/Throat: Hearing grossly intact, nares w/o erythema or drainage Eyes: PER, EOMI, sclera nonicteric.  Neck: Supple, no masses.  No bruit or JVD.  Pulmonary:  Good air movement, no audible wheezing, no use of accessory muscles.  Cardiac: RRR, normal S1, S2, no Murmurs. Vascular: Bilateral carotid bruits Vessel Right Left  Radial Palpable Palpable  Carotid Palpable Palpable  Gastrointestinal: soft, non-distended. No guarding/no peritoneal signs.  Musculoskeletal: M/S 5/5 throughout.  No visible deformity.  Neurologic: CN 2-12 intact. Pain and light touch intact in extremities.  Symmetrical.  Speech is fluent. Motor exam as listed above. Psychiatric: Judgment intact, Mood & affect appropriate for pt's clinical situation. Dermatologic: No rashes or ulcers noted.  No changes consistent with cellulitis.   CBC Lab Results  Component Value Date   WBC 4.3 09/01/2018   HGB 13.0 09/01/2018   HCT 39.4 09/01/2018   MCV 103.4 (H) 09/01/2018   PLT 207 09/01/2018    BMET    Component Value Date/Time   NA 138 09/02/2018 0402   K 3.4 (L) 09/02/2018 0402   CL 109 09/02/2018 0402   CO2 23 09/02/2018 0402   GLUCOSE 130 (H) 09/02/2018 0402   BUN 9 09/02/2018 0402   CREATININE 0.72 09/02/2018 0402   CALCIUM 8.9 09/02/2018 0402   GFRNONAA >60 09/02/2018 0402   GFRAA >60 09/02/2018 0402   CrCl cannot be calculated (Patient's most recent lab result is older than the maximum 21 days allowed.).  COAG No results found for: INR, PROTIME  Radiology CT ANGIO NECK W OR WO CONTRAST  Result Date: 09/19/2021 CLINICAL DATA:  Carotid artery stenosis. Dizziness and visual disturbances. EXAM: CT ANGIOGRAPHY NECK TECHNIQUE: Multidetector CT imaging of the neck was performed using the standard protocol during bolus administration of intravenous contrast. Multiplanar CT image reconstructions and MIPs were obtained to evaluate the vascular anatomy. Carotid stenosis measurements (when  applicable) are obtained utilizing NASCET criteria, using the distal internal carotid diameter as the denominator. CONTRAST:  59mL OMNIPAQUE IOHEXOL 350 MG/ML SOLN COMPARISON:  Carotid Doppler ultrasound 03/13/2021 FINDINGS: Aortic arch: Standard 3 vessel aortic arch with mild atherosclerotic plaque. Atherosclerotic plaque in the brachiocephalic and subclavian arteries without flow limiting stenosis. Right carotid system: Patent with predominantly soft plaque in the carotid bulb resulting in approximately 80% ICA stenosis at the distal aspect of the bulb. Tortuous distal cervical ICA. Left carotid system: Patent with mixed calcified and soft plaque in the proximal common carotid artery resulting in 70% stenosis. Soft and calcified plaque in the proximal ICA resulting in luminal irregularity but no stenosis of 50% or greater. Tortuous mid cervical ICA. Vertebral arteries: The vertebral arteries are patent without evidence of a significant stenosis on the right. There is a small curvilinear filling defect in the left subclavian artery at the origin of the left vertebral artery which could reflect a focal dissection flap,  and this results in severe stenosis of the left vertebral origin estimated at 85% or greater. The left vertebral artery is otherwise widely patent throughout the remainder of the neck and is mildly dominant. Skeleton: Asymmetrically advanced left-sided facet arthrosis at C3-4 and C4-5. Other neck: No evidence of cervical lymphadenopathy or mass. Upper chest: Mild centrilobular emphysema. IMPRESSION: 1. 80% proximal right ICA stenosis. 2. 70% proximal left common carotid artery stenosis. 3. 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 4. Aortic Atherosclerosis (ICD10-I70.0) and Emphysema (ICD10-J43.9). Electronically Signed   By: Sebastian AcheAllen  Grady M.D.   On: 09/19/2021 08:45   CARDIAC CATHETERIZATION  Result Date: 09/30/2021   Previously  placed Mid LAD stent (unknown type) is  widely patent.   The left ventricular systolic function is normal.   LV end diastolic pressure is moderately elevated.   The left ventricular ejection fraction is 55-65% by visual estimate.   There is severe aortic valve stenosis.  Recorded around 0.5 cm 2   Mild pulmonary hypertension elevated PA of mean 27   Widely patent mid LAD stent   No significant obstructive coronary artery disease   Recommend referral for possible evaluation for TAVR Conclusion Mild pulm hypertension PA pressures of 27 main mean wedge of 18 Normal left ventricular function around 60% Left heart cath Left main minor irregularities LAD large minor irregularities widely patent mid stent Circumflex large minor irregularities left dominant RCA small nondominant Peak to peak gradient across that aortic valve is between 30 and 40 Estimated valve area was calculated to be 0.5 cm Suggestive of moderate to severe aortic stenosis Recommend referral to tertiary care center for possible TAVR evaluation     Assessment/Plan 1. Bilateral carotid artery stenosis Recommend:  The patient is symptomatic with respect to the carotid stenosis.  The patient now has progressed and has a lesion the is >70%.  Patient's CT angiography of the carotid arteries confirms >70% right ICA stenosis.  The anatomical considerations support stenting over surgery.  This was discussed in detail with the patient.  The risks, benefits and alternative therapies were reviewed in detail with the patient.  All questions were answered.  The patient agrees to proceed with stenting of the right carotid artery.  Continue antiplatelet therapy as prescribed. Continue management of CAD, HTN and Hyperlipidemia. Healthy heart diet, encouraged exercise at least 4 times per week.    2. Essential hypertension Continue antihypertensive medications as already ordered, these medications have been reviewed and there are no changes at this time.    3. Chronic obstructive pulmonary disease, unspecified COPD type (HCC) Continue pulmonary medications and aerosols as already ordered, these medications have been reviewed and there are no changes at this time.    4. Mixed hyperlipidemia Continue statin as ordered and reviewed, no changes at this time     Levora DredgeGregory Meegan Shanafelt, MD  10/05/2021 1:14 PM

## 2021-10-17 ENCOUNTER — Telehealth (INDEPENDENT_AMBULATORY_CARE_PROVIDER_SITE_OTHER): Payer: Self-pay

## 2021-10-17 NOTE — Telephone Encounter (Signed)
I attempted to  contact the patient and her phone has  been disconnected or no longer in service. I then attempted to contact her emergency contacts and a message was left with both to have the patient to return my call.

## 2021-10-17 NOTE — Telephone Encounter (Signed)
Patient called back and is scheduled with Dr. Delana Meyer for a right carotid stent placement on 10/25/21 with a 1:00 pm arrival time to the [facility]. Pre-procedure instructions were discussed and will be mailed.

## 2021-10-23 ENCOUNTER — Other Ambulatory Visit: Payer: Self-pay

## 2021-10-23 ENCOUNTER — Other Ambulatory Visit
Admission: RE | Admit: 2021-10-23 | Discharge: 2021-10-23 | Disposition: A | Discharge status: Home / Self Care | Payer: Medicaid Other | Source: Ambulatory Visit | Attending: Vascular Surgery | Admitting: Vascular Surgery

## 2021-10-23 DIAGNOSIS — Z01812 Encounter for preprocedural laboratory examination: Secondary | ICD-10-CM | POA: Insufficient documentation

## 2021-10-23 DIAGNOSIS — Z20822 Contact with and (suspected) exposure to covid-19: Secondary | ICD-10-CM | POA: Diagnosis not present

## 2021-10-24 LAB — SARS CORONAVIRUS 2 (TAT 6-24 HRS): SARS Coronavirus 2: NEGATIVE

## 2021-10-25 ENCOUNTER — Encounter: Payer: Self-pay | Admitting: Vascular Surgery

## 2021-10-25 ENCOUNTER — Other Ambulatory Visit (INDEPENDENT_AMBULATORY_CARE_PROVIDER_SITE_OTHER): Payer: Self-pay | Admitting: Nurse Practitioner

## 2021-10-25 ENCOUNTER — Encounter
Admission: RE | Disposition: A | Discharge status: Home / Self Care | Payer: Self-pay | Source: Home / Self Care | Attending: Vascular Surgery

## 2021-10-25 ENCOUNTER — Inpatient Hospital Stay
Admission: RE | Admit: 2021-10-25 | Discharge: 2021-10-26 | DRG: 036 | Disposition: A | Discharge status: Home / Self Care | Payer: Medicaid Other | Attending: Vascular Surgery | Admitting: Vascular Surgery

## 2021-10-25 ENCOUNTER — Other Ambulatory Visit: Payer: Self-pay

## 2021-10-25 DIAGNOSIS — I252 Old myocardial infarction: Secondary | ICD-10-CM | POA: Diagnosis not present

## 2021-10-25 DIAGNOSIS — Z7982 Long term (current) use of aspirin: Secondary | ICD-10-CM | POA: Diagnosis not present

## 2021-10-25 DIAGNOSIS — I2510 Atherosclerotic heart disease of native coronary artery without angina pectoris: Secondary | ICD-10-CM | POA: Diagnosis present

## 2021-10-25 DIAGNOSIS — E782 Mixed hyperlipidemia: Secondary | ICD-10-CM | POA: Diagnosis present

## 2021-10-25 DIAGNOSIS — Z87891 Personal history of nicotine dependence: Secondary | ICD-10-CM

## 2021-10-25 DIAGNOSIS — I6521 Occlusion and stenosis of right carotid artery: Secondary | ICD-10-CM | POA: Diagnosis not present

## 2021-10-25 DIAGNOSIS — I10 Essential (primary) hypertension: Secondary | ICD-10-CM | POA: Diagnosis present

## 2021-10-25 DIAGNOSIS — Z955 Presence of coronary angioplasty implant and graft: Secondary | ICD-10-CM | POA: Diagnosis not present

## 2021-10-25 DIAGNOSIS — Z885 Allergy status to narcotic agent status: Secondary | ICD-10-CM | POA: Diagnosis not present

## 2021-10-25 DIAGNOSIS — I6523 Occlusion and stenosis of bilateral carotid arteries: Principal | ICD-10-CM | POA: Diagnosis present

## 2021-10-25 DIAGNOSIS — Z8673 Personal history of transient ischemic attack (TIA), and cerebral infarction without residual deficits: Secondary | ICD-10-CM

## 2021-10-25 DIAGNOSIS — Z9049 Acquired absence of other specified parts of digestive tract: Secondary | ICD-10-CM

## 2021-10-25 DIAGNOSIS — J449 Chronic obstructive pulmonary disease, unspecified: Secondary | ICD-10-CM | POA: Diagnosis present

## 2021-10-25 HISTORY — PX: CAROTID PTA/STENT INTERVENTION: CATH118231

## 2021-10-25 LAB — MRSA NEXT GEN BY PCR, NASAL: MRSA by PCR Next Gen: NOT DETECTED

## 2021-10-25 LAB — CREATININE, SERUM
Creatinine, Ser: 0.76 mg/dL (ref 0.44–1.00)
GFR, Estimated: >60 mL/min (ref >60)

## 2021-10-25 LAB — GLUCOSE, CAPILLARY: Glucose-Capillary: 109 mg/dL — ABNORMAL HIGH (ref 70–99)

## 2021-10-25 LAB — BUN: BUN: 11 mg/dL (ref 6–20)

## 2021-10-25 SURGERY — CAROTID PTA/STENT INTERVENTION
Anesthesia: Moderate Sedation | Laterality: Right

## 2021-10-25 MED ORDER — MIDAZOLAM HCL 2 MG/2ML IJ SOLN
INTRAMUSCULAR | Status: DC | PRN
  Administered 2021-10-25: 1 mg via INTRAVENOUS
  Administered 2021-10-25: 2 mg via INTRAVENOUS
  Administered 2021-10-25: .5 mg via INTRAVENOUS

## 2021-10-25 MED ORDER — GUAIFENESIN-DM 100-10 MG/5ML PO SYRP: 15.0000 mL | ORAL_SOLUTION | ORAL | Status: DC | PRN

## 2021-10-25 MED ORDER — PANTOPRAZOLE SODIUM 40 MG IV SOLR
40.0000 mg | Freq: Every day | INTRAVENOUS | Status: DC
  Administered 2021-10-25: 40 mg via INTRAVENOUS
  Filled 2021-10-25 (×2): qty 40

## 2021-10-25 MED ORDER — DOCUSATE SODIUM 100 MG PO CAPS
100.0000 mg | ORAL_CAPSULE | Freq: Every day | ORAL | Status: DC
  Filled 2021-10-25 (×2): qty 1

## 2021-10-25 MED ORDER — MIDAZOLAM HCL 5 MG/5ML IJ SOLN
INTRAMUSCULAR | Status: AC
  Filled 2021-10-25: qty 5

## 2021-10-25 MED ORDER — CLOPIDOGREL BISULFATE 75 MG PO TABS
300.0000 mg | ORAL_TABLET | ORAL | Status: AC
  Administered 2021-10-25: 300 mg via ORAL
  Filled 2021-10-25: qty 4

## 2021-10-25 MED ORDER — CLOPIDOGREL BISULFATE 75 MG PO TABS
75.0000 mg | ORAL_TABLET | Freq: Every day | ORAL | Status: DC
  Administered 2021-10-26: 75 mg via ORAL
  Filled 2021-10-25: qty 1

## 2021-10-25 MED ORDER — HYDROMORPHONE HCL 1 MG/ML IJ SOLN: 1.0000 mg | Freq: Once | INTRAMUSCULAR | Status: DC | PRN

## 2021-10-25 MED ORDER — ATROPINE SULFATE 1 MG/10ML IJ SOSY
PREFILLED_SYRINGE | INTRAMUSCULAR | Status: AC
  Filled 2021-10-25: qty 10

## 2021-10-25 MED ORDER — ONDANSETRON HCL 4 MG/2ML IJ SOLN
4.0000 mg | Freq: Four times a day (QID) | INTRAMUSCULAR | Status: DC | PRN
  Administered 2021-10-25 – 2021-10-26 (×2): 4 mg via INTRAVENOUS
  Filled 2021-10-25 (×2): qty 2

## 2021-10-25 MED ORDER — ONDANSETRON HCL 4 MG/2ML IJ SOLN: 4.0000 mg | Freq: Four times a day (QID) | INTRAMUSCULAR | Status: DC | PRN

## 2021-10-25 MED ORDER — ACETAMINOPHEN 325 MG PO TABS
325.0000 mg | ORAL_TABLET | ORAL | Status: DC | PRN
  Administered 2021-10-25: 650 mg via ORAL
  Administered 2021-10-25: 325 mg via ORAL
  Filled 2021-10-25 (×2): qty 2

## 2021-10-25 MED ORDER — CEFAZOLIN SODIUM-DEXTROSE 2-4 GM/100ML-% IV SOLN
INTRAVENOUS | Status: AC
  Filled 2021-10-25: qty 100

## 2021-10-25 MED ORDER — PHENYLEPHRINE HCL (PRESSORS) 10 MG/ML IV SOLN
INTRAVENOUS | Status: AC
  Filled 2021-10-25: qty 1

## 2021-10-25 MED ORDER — SODIUM CHLORIDE 0.9 % IV SOLN: 500.0000 mL | Freq: Once | INTRAVENOUS | Status: DC | PRN

## 2021-10-25 MED ORDER — ALBUTEROL SULFATE (2.5 MG/3ML) 0.083% IN NEBU
3.0000 mL | INHALATION_SOLUTION | Freq: Four times a day (QID) | RESPIRATORY_TRACT | Status: DC
  Administered 2021-10-25 – 2021-10-26 (×2): 3 mL via RESPIRATORY_TRACT
  Filled 2021-10-25 (×4): qty 3

## 2021-10-25 MED ORDER — HEPARIN SODIUM (PORCINE) 1000 UNIT/ML IJ SOLN
INTRAMUSCULAR | Status: AC
  Filled 2021-10-25: qty 10

## 2021-10-25 MED ORDER — METHYLPREDNISOLONE SODIUM SUCC 125 MG IJ SOLR: 125.0000 mg | Freq: Once | INTRAMUSCULAR | Status: DC | PRN

## 2021-10-25 MED ORDER — CEFAZOLIN SODIUM-DEXTROSE 1-4 GM/50ML-% IV SOLN
INTRAVENOUS | Status: AC | PRN
  Administered 2021-10-25: 2 g via INTRAVENOUS

## 2021-10-25 MED ORDER — ATROPINE SULFATE 1 MG/10ML IJ SOSY
PREFILLED_SYRINGE | INTRAMUSCULAR | Status: DC | PRN
  Administered 2021-10-25: 1 mg via INTRAVENOUS

## 2021-10-25 MED ORDER — CEFAZOLIN SODIUM-DEXTROSE 2-4 GM/100ML-% IV SOLN: 2.0000 g | Freq: Once | INTRAVENOUS | Status: DC

## 2021-10-25 MED ORDER — LABETALOL HCL 5 MG/ML IV SOLN: 10.0000 mg | INTRAVENOUS | Status: DC | PRN

## 2021-10-25 MED ORDER — FENTANYL CITRATE (PF) 100 MCG/2ML IJ SOLN
INTRAMUSCULAR | Status: DC | PRN
  Administered 2021-10-25: 50 ug via INTRAVENOUS
  Administered 2021-10-25: 25 ug via INTRAVENOUS
  Administered 2021-10-25: 50 ug via INTRAVENOUS

## 2021-10-25 MED ORDER — PHENOL 1.4 % MT LIQD
1.0000 | OROMUCOSAL | Status: DC | PRN
  Filled 2021-10-25: qty 177

## 2021-10-25 MED ORDER — SODIUM CHLORIDE 0.9 % IV BOLUS
INTRAVENOUS | Status: AC | PRN
  Administered 2021-10-25: 500 mL via INTRAVENOUS

## 2021-10-25 MED ORDER — HEPARIN SODIUM (PORCINE) 1000 UNIT/ML IJ SOLN
INTRAMUSCULAR | Status: DC | PRN
  Administered 2021-10-25: 8000 [IU] via INTRAVENOUS

## 2021-10-25 MED ORDER — KCL IN DEXTROSE-NACL 20-5-0.9 MEQ/L-%-% IV SOLN
INTRAVENOUS | Status: DC
  Filled 2021-10-25 (×4): qty 1000

## 2021-10-25 MED ORDER — DOPAMINE-DEXTROSE 3.2-5 MG/ML-% IV SOLN: 0.0000 ug/kg/min | INTRAVENOUS | Status: DC

## 2021-10-25 MED ORDER — FAMOTIDINE 20 MG PO TABS: 40.0000 mg | ORAL_TABLET | Freq: Once | ORAL | Status: DC | PRN

## 2021-10-25 MED ORDER — ESCITALOPRAM OXALATE 20 MG PO TABS
20.0000 mg | ORAL_TABLET | Freq: Every day | ORAL | Status: DC
  Administered 2021-10-26: 20 mg via ORAL
  Filled 2021-10-25: qty 1

## 2021-10-25 MED ORDER — PHENYLEPHRINE 40 MCG/ML (10ML) SYRINGE FOR IV PUSH (FOR BLOOD PRESSURE SUPPORT)
PREFILLED_SYRINGE | INTRAVENOUS | Status: AC
  Filled 2021-10-25: qty 10

## 2021-10-25 MED ORDER — SODIUM CHLORIDE 0.9 % IV SOLN: INTRAVENOUS | Status: DC

## 2021-10-25 MED ORDER — DIPHENHYDRAMINE HCL 50 MG/ML IJ SOLN: 50.0000 mg | Freq: Once | INTRAMUSCULAR | Status: DC | PRN

## 2021-10-25 MED ORDER — METOPROLOL TARTRATE 5 MG/5ML IV SOLN: 2.0000 mg | INTRAVENOUS | Status: DC | PRN

## 2021-10-25 MED ORDER — HYDRALAZINE HCL 20 MG/ML IJ SOLN: 5.0000 mg | INTRAMUSCULAR | Status: DC | PRN

## 2021-10-25 MED ORDER — ZOLPIDEM TARTRATE 5 MG PO TABS
5.0000 mg | ORAL_TABLET | Freq: Every day | ORAL | Status: DC
  Administered 2021-10-25: 5 mg via ORAL
  Filled 2021-10-25: qty 1

## 2021-10-25 MED ORDER — CEFAZOLIN SODIUM-DEXTROSE 2-4 GM/100ML-% IV SOLN
2.0000 g | Freq: Three times a day (TID) | INTRAVENOUS | Status: AC
  Administered 2021-10-25 – 2021-10-26 (×2): 2 g via INTRAVENOUS
  Filled 2021-10-25 (×3): qty 100

## 2021-10-25 MED ORDER — ACETAMINOPHEN 325 MG RE SUPP
325.0000 mg | RECTAL | Status: DC | PRN
  Filled 2021-10-25: qty 2

## 2021-10-25 MED ORDER — DOPAMINE-DEXTROSE 3.2-5 MG/ML-% IV SOLN
INTRAVENOUS | Status: AC
  Filled 2021-10-25: qty 250

## 2021-10-25 MED ORDER — ROPINIROLE HCL 1 MG PO TABS
1.5000 mg | ORAL_TABLET | Freq: Every day | ORAL | Status: DC
  Administered 2021-10-25: 1.5 mg via ORAL
  Filled 2021-10-25 (×2): qty 2

## 2021-10-25 MED ORDER — TIOTROPIUM BROMIDE MONOHYDRATE 18 MCG IN CAPS
1.0000 | ORAL_CAPSULE | Freq: Every day | RESPIRATORY_TRACT | Status: DC
  Filled 2021-10-25: qty 5

## 2021-10-25 MED ORDER — ALUM & MAG HYDROXIDE-SIMETH 200-200-20 MG/5ML PO SUSP: 15.0000 mL | ORAL | Status: DC | PRN

## 2021-10-25 MED ORDER — FENTANYL CITRATE PF 50 MCG/ML IJ SOSY
PREFILLED_SYRINGE | INTRAMUSCULAR | Status: AC
  Filled 2021-10-25: qty 1

## 2021-10-25 MED ORDER — MIDAZOLAM HCL 2 MG/ML PO SYRP: 8.0000 mg | ORAL_SOLUTION | Freq: Once | ORAL | Status: DC | PRN

## 2021-10-25 MED ORDER — PHENYLEPHRINE HCL-NACL 20-0.9 MG/250ML-% IV SOLN
INTRAVENOUS | Status: AC
  Filled 2021-10-25: qty 250

## 2021-10-25 MED ORDER — CHLORHEXIDINE GLUCONATE CLOTH 2 % EX PADS
6.0000 | MEDICATED_PAD | Freq: Every day | CUTANEOUS | Status: DC
  Administered 2021-10-25: 6 via TOPICAL

## 2021-10-25 MED ORDER — ASPIRIN EC 81 MG PO TBEC
81.0000 mg | DELAYED_RELEASE_TABLET | Freq: Every day | ORAL | Status: DC
  Administered 2021-10-25 – 2021-10-26 (×2): 81 mg via ORAL
  Filled 2021-10-25 (×2): qty 1

## 2021-10-25 MED ORDER — DOPAMINE-DEXTROSE 3.2-5 MG/ML-% IV SOLN
INTRAVENOUS | Status: AC
  Administered 2021-10-25: 6 ug/kg/min via INTRAVENOUS
  Filled 2021-10-25: qty 250

## 2021-10-25 MED ORDER — FENTANYL CITRATE (PF) 100 MCG/2ML IJ SOLN
INTRAMUSCULAR | Status: AC
  Filled 2021-10-25: qty 2

## 2021-10-25 MED ORDER — NICOTINE 14 MG/24HR TD PT24
14.0000 mg | MEDICATED_PATCH | Freq: Every day | TRANSDERMAL | Status: DC
  Filled 2021-10-25: qty 1

## 2021-10-25 MED ORDER — IODIXANOL 320 MG/ML IV SOLN
INTRAVENOUS | Status: DC | PRN
  Administered 2021-10-25: 55 mL

## 2021-10-25 SURGICAL SUPPLY — 20 items
BALLN VTRAC 4.5X20X135 (BALLOONS) ×2
BALLOON VTRAC 4.5X20X135 (BALLOONS) IMPLANT
CANNULA 5F STIFF (CANNULA) ×1 IMPLANT
CATH ANGIO 5F PIGTAIL 100CM (CATHETERS) ×1 IMPLANT
CATH BEACON 5 .035 100 H1 TIP (CATHETERS) ×1 IMPLANT
COVER PROBE U/S 5X48 (MISCELLANEOUS) ×1 IMPLANT
DEVICE EMBOSHIELD NAV6 4.0-7.0 (FILTER) ×1 IMPLANT
DEVICE SAFEGUARD 24CM (GAUZE/BANDAGES/DRESSINGS) ×2 IMPLANT
DEVICE STARCLOSE SE CLOSURE (Vascular Products) ×1 IMPLANT
DEVICE TORQUE .025-.038 (MISCELLANEOUS) ×1 IMPLANT
GLIDEWIRE ANGLED SS 035X260CM (WIRE) ×1 IMPLANT
GUIDEWIRE VASC STIFF .038X260 (WIRE) ×1 IMPLANT
KIT CAROTID MANIFOLD (MISCELLANEOUS) ×1 IMPLANT
KIT ENCORE 26 ADVANTAGE (KITS) ×1 IMPLANT
PACK ANGIOGRAPHY (CUSTOM PROCEDURE TRAY) ×3 IMPLANT
SHEATH BRITE TIP 5FRX11 (SHEATH) ×1 IMPLANT
SHEATH SHUTTLE 6FRX80 (SHEATH) ×1 IMPLANT
STENT XACT CAR 9-7X40X136 (Permanent Stent) ×1 IMPLANT
SYR MEDRAD MARK 7 150ML (SYRINGE) ×1 IMPLANT
WIRE GUIDERIGHT .035X150 (WIRE) ×1 IMPLANT

## 2021-10-25 NOTE — Progress Notes (Signed)
Pt was brought up from specials, without been given plavix that was ordered stat at 1515. I was not relayed this in report. Gave plavix to patient upon arrival. Per MD PAD can come off at 7am on 10/26/2021 and advanced diet from npo to heart healthy.

## 2021-10-25 NOTE — Interval H&P Note (Signed)
History and Physical Interval Note:  10/25/2021 2:10 PM  Danielle Briggs  has presented today for surgery, with the diagnosis of RT Carotid Stent    RT Carotid Artery Stenosis   ABBOTT Rep  cc: S Willey Covid test on 1-30.  The various methods of treatment have been discussed with the patient and family. After consideration of risks, benefits and other options for treatment, the patient has consented to  Procedure(s): CAROTID PTA/STENT INTERVENTION (Right) as a surgical intervention.  The patient's history has been reviewed, patient examined, no change in status, stable for surgery.  I have reviewed the patient's chart and labs.  Questions were answered to the patient's satisfaction.     Levora Dredge

## 2021-10-25 NOTE — Op Note (Signed)
OPERATIVE NOTE DATE: 10/25/2021  PROCEDURE:  Ultrasound guidance for vascular access right femoral artery  Placement of a 9 x 7 x 40 exact stent with the use of the NAV-6 embolic protection device in the right internal carotid artery  PRE-OPERATIVE DIAGNOSIS: 1.  Greater than 90% right internal carotid artery stenosis. 2.  Recent right hemispheric CVA  POST-OPERATIVE DIAGNOSIS:  Same as above  SURGEON: Levora Dredge  ASSISTANT(S): None  ANESTHESIA: local/MCS  ESTIMATED BLOOD LOSS:  100 cc  CONTRAST: 55 cc  FLUORO TIME: 6.2 minutes  MODERATE CONSCIOUS SEDATION TIME: Continuous ECG pulse oximetry and cardiopulmonary monitoring was performed throughout the entire procedure by the interventional radiology nurse total sedation time was 56 minutes and 17 seconds  FINDING(S): 1.   Greater than 90% right internal carotid artery stenosis  SPECIMEN(S):   none  INDICATIONS:   Patient is a 61 y.o. female who presents with symptomatic critical stenosis of the right internal carotid artery stenosis.  The patient has significant coronary artery disease, a high bifurcation and carotid artery stenting was felt to be preferred to endarterectomy for that reason.  Risks and benefits were discussed and informed consent was obtained.   DESCRIPTION: After obtaining full informed written consent, the patient was brought back to the vascular suite and placed supine upon the table.  The patient received IV antibiotics prior to induction. Moderate conscious sedation was administered during a face to face encounter with the patient throughout the procedure with my supervision of the RN administering medicines and monitoring the patients vital signs and mental status throughout from the start of the procedure until the patient was taken to the recovery room.    After obtaining adequate sedation, the patient was prepped and draped in the standard fashion.    A first assistant is required in order to  allow for a safe and more efficient operation.  Duties include wire manipulations as well as assistance with pinning the sheath and positioning the detector for proper angle, assistance and deploying the stent in the proper position and appropriate images.  Further duties include assisting with patient positioning during the procedure.  I believe that this procedure requires a first assistant in order for it to be performed at a level in keeping with the high standards of this institution.  The right femoral artery was visualized with ultrasound and found to be widely patent. It was then accessed under direct ultrasound guidance without difficulty with a micropuncture needle. A permanent image was recorded.  A microwire was then advanced without difficulty under fluoroscopic guidance followed by a micro-sheath.  A J-wire was placed and we then placed a 6 French sheath. The patient was then heparinized and a total of 8000 units of intravenous heparin were given and an ACT was checked to confirm successful anticoagulation.   A pigtail catheter was then placed into the ascending aorta. This showed type I arch with moderate diffuse atherosclerotic changes of the 3 great vessels.  However, there was no hemodynamically significant stenosis identified which is consistent with the preoperative CT scan.. The right common carotid artery was then selectively cannulated without difficulty with a H1 catheter and the catheter advanced into the mid right common carotid artery.  Cervical and cerebral carotid angiography was then performed. There were no obvious intracranial filling defects. The right carotid artery demonstrated greater than 90% stenosis over 20 to 25 mm in length of the right internal carotid artery from its origin extending distally.  I then selected  the external carotid artery with the stiff angled Glidewire and advanced the H1 catheter into the external carotid artery.  H1 catheter was then used to exchanged  for the Amplatz Super Stiff wire. Over the Amplatz Super Stiff wire, a 6 [place] shuttle sheath was placed into the mid common carotid artery. I then used the NAV-6  Embolic protection device and crossed the lesion and parked this in the distal internal carotid artery at the base of the skull.  I then selected a 9 x 7 x 40 exact stent. This was deployed across the lesion encompassing it in its entirety. A 4.5 mm by 20 mm balloon was used to post dilate the stent. Only about a 5% residual stenosis was present after angioplasty. Completion angiogram showed normal intracranial filling without new defects. At this point I elected to terminate the procedure. The sheath was removed and StarClose closure device was deployed in the right femoral artery with excellent hemostatic result. The patient was taken to the recovery room in stable condition having tolerated the procedure well.  COMPLICATIONS: none  CONDITION: stable  Levora Dredge 10/25/2021 3:10 PM   This note was created with Dragon Medical transcription system. Any errors in dictation are purely unintentional.

## 2021-10-26 ENCOUNTER — Encounter: Payer: Self-pay | Admitting: Vascular Surgery

## 2021-10-26 LAB — BASIC METABOLIC PANEL
Anion gap: 4 — ABNORMAL LOW (ref 5–15)
BUN: 8 mg/dL (ref 6–20)
CO2: 21 mmol/L — ABNORMAL LOW (ref 22–32)
Calcium: 8.3 mg/dL — ABNORMAL LOW (ref 8.9–10.3)
Chloride: 111 mmol/L (ref 98–111)
Creatinine, Ser: 0.76 mg/dL (ref 0.44–1.00)
GFR, Estimated: >60 mL/min (ref >60)
Glucose, Bld: 150 mg/dL — ABNORMAL HIGH (ref 70–99)
Potassium: 3.8 mmol/L (ref 3.5–5.1)
Sodium: 136 mmol/L (ref 135–145)

## 2021-10-26 LAB — CBC
HCT: 38.8 % (ref 36.0–46.0)
Hemoglobin: 12.8 g/dL (ref 12.0–15.0)
MCH: 33.1 pg (ref 26.0–34.0)
MCHC: 33 g/dL (ref 30.0–36.0)
MCV: 100.3 fL — ABNORMAL HIGH (ref 80.0–100.0)
Platelets: 178 10*3/uL (ref 150–400)
RBC: 3.87 MIL/uL (ref 3.87–5.11)
RDW: 13.3 % (ref 11.5–15.5)
WBC: 7.5 10*3/uL (ref 4.0–10.5)
nRBC: 0 % (ref 0.0–0.2)

## 2021-10-26 LAB — POCT ACTIVATED CLOTTING TIME: Activated Clotting Time: 335 seconds

## 2021-10-26 MED ORDER — PANTOPRAZOLE SODIUM 40 MG PO TBEC: 40.0000 mg | DELAYED_RELEASE_TABLET | Freq: Every day | ORAL | Status: DC

## 2021-10-26 MED ORDER — DOPAMINE-DEXTROSE 3.2-5 MG/ML-% IV SOLN: 0.0000 ug/kg/min | INTRAVENOUS | Status: DC

## 2021-10-26 MED ORDER — CLOPIDOGREL BISULFATE 75 MG PO TABS: 75.0000 mg | ORAL_TABLET | Freq: Every day | ORAL | 11 refills | Status: AC

## 2021-10-26 NOTE — Discharge Summary (Signed)
[facility]    Discharge Summary    Patient ID:  Danielle Briggs MRN: [id number] DOB/AGE: [birth year] 61 y.o.  Admit date: 10/25/2021 Discharge date: 10/26/2021 Date of Surgery: 10/25/2021 Surgeon: Surgeon(s): Lexie Koehl, Dolores Lory, MD  Admission Diagnosis: Carotid stenosis, symptomatic w/o infarct, right [I65.21]  Discharge Diagnoses:  Carotid stenosis, symptomatic w/o infarct, right [I65.21]  Secondary Diagnoses: Past Medical History:  Diagnosis Date   COPD (chronic obstructive pulmonary disease) ([place])    Coronary artery disease    Hypertension    MI (myocardial infarction) ([place])     Procedure(s): CAROTID PTA/STENT INTERVENTION  Discharged Condition: good  HPI:  Patient presented to [facility] yesterday for placement of a right internal carotid artery stent for symptomatic 90% carotid artery stenosis.  Procedure was as planned without complication.  Patient has done well overnight and is fit for discharge.  Hospital Course:  Danielle Briggs is a 61 y.o. female is S/P Right Procedure(s): CAROTID PTA/STENT INTERVENTION Extubated: POD # 0 Physical exam: Neurologically the patient is unchanged.  Right groin is clean dry and intact Post-op wounds clean, dry, intact or healing well Pt. Ambulating, voiding and taking PO diet without difficulty. Pt pain controlled with PO pain meds. Labs as below Complications:none  Consults:    Significant Diagnostic Studies: CBC Lab Results  Component Value Date   WBC 7.5 10/26/2021   HGB 12.8 10/26/2021   HCT 38.8 10/26/2021   MCV 100.3 (H) 10/26/2021   PLT 178 10/26/2021    BMET    Component Value Date/Time   NA 136 10/26/2021 0445   K 3.8 10/26/2021 0445   CL 111 10/26/2021 0445   CO2 21 (L) 10/26/2021 0445   GLUCOSE 150 (H) 10/26/2021 0445   BUN 8 10/26/2021 0445   CREATININE 0.76 10/26/2021 0445   CALCIUM 8.3 (L) 10/26/2021 0445   GFRNONAA >60 10/26/2021 0445    GFRAA >60 09/02/2018 0402   COAG No results found for: INR, PROTIME   Disposition:  Discharge to :Home Discharge Instructions     Call MD for:  redness, tenderness, or signs of infection (pain, swelling, bleeding, redness, odor or green/yellow discharge around incision site)   Complete by: As directed    Call MD for:  severe or increased pain, loss or decreased feeling  in affected limb(s)   Complete by: As directed    Call MD for:  temperature >100.5   Complete by: As directed    Discharge instructions   Complete by: As directed    Okay to shower   Driving Restrictions   Complete by: As directed    No driving for 24 hours   Lifting restrictions   Complete by: As directed    No lifting for 1 week   No dressing needed   Complete by: As directed    Replace only if drainage present   Resume previous diet   Complete by: As directed       Allergies as of 10/26/2021       Reactions   Oxycodone Nausea Only        Medication List     TAKE these medications    amLODipine 5 MG tablet Commonly known as: NORVASC Take 5 mg by mouth daily. for high blood pressure   aspirin EC 81 MG tablet Take 81 mg by mouth daily. Swallow whole.   benzonatate 100 MG capsule Commonly known as: TESSALON Take 100 mg by mouth 3 (three) times daily.  celecoxib 100 MG capsule Commonly known as: CELEBREX Take 100 mg by mouth 2 (two) times daily as needed.   clonazePAM 1 MG tablet Commonly known as: KLONOPIN Take 0.5-1 mg by mouth 2 (two) times daily as needed.   clopidogrel 75 MG tablet Commonly known as: Plavix Take 1 tablet (75 mg total) by mouth daily.   [facility] Vitamin B12 1000 MCG tablet Generic drug: cyanocobalamin Take 1,000 mcg by mouth daily.   escitalopram 20 MG tablet Commonly known as: LEXAPRO Take 20 mg by mouth daily.   HYDROcodone-acetaminophen 5-325 MG tablet Commonly known as: NORCO/VICODIN Take 1 tablet by mouth every 6 (six) hours as needed for moderate pain.    ibuprofen 800 MG tablet Commonly known as: ADVIL TAKE 1 TABLET BY MOUTH EVERY 8 HOURS AS NEEDED FOR WITH FOOD.   isosorbide mononitrate 30 MG 24 hr tablet Commonly known as: IMDUR Take by mouth.   lamoTRIgine 25 MG tablet Commonly known as: LAMICTAL Take 25 mg by mouth 2 (two) times daily.   lisinopril 20 MG tablet Commonly known as: ZESTRIL Take 20 mg by mouth daily. for high blood pressure   metoprolol tartrate 25 MG tablet Commonly known as: LOPRESSOR Take 25 mg by mouth 2 (two) times daily.   nicotine 14 mg/24hr patch Commonly known as: NICODERM CQ - dosed in mg/24 hours Place 1 patch (14 mg total) onto the skin daily.   nitroGLYCERIN 0.4 MG SL tablet Commonly known as: NITROSTAT PLACE 1 TABLET UNDER THE TONGUE EVERY 5 MIN AS NEEDED FOR CHEST PAIN, (MAX 3 DOSES PER EPISODE)   Odorless Coated Fish Oil 1000 MG Cpdr Take 1,000 mg by mouth 2 (two) times daily.   ondansetron 4 MG disintegrating tablet Commonly known as: ZOFRAN-ODT Take 1 tablet (4 mg total) by mouth every 8 (eight) hours as needed.   ondansetron 4 MG tablet Commonly known as: Zofran Take 1 tablet (4 mg total) by mouth every 8 (eight) hours as needed for nausea or vomiting.   ProAir HFA 108 (90 Base) MCG/ACT inhaler Generic drug: albuterol SMARTSIG:2 Puff(s) By Mouth Every 4-6 Hours PRN   rOPINIRole 0.25 MG tablet Commonly known as: REQUIP Take 1.5 mg by mouth at bedtime.   Spiriva HandiHaler 18 MCG inhalation capsule Generic drug: tiotropium Take 1 capsule by mouth daily.   zolpidem 5 MG tablet Commonly known as: AMBIEN Take 5 mg by mouth at bedtime.       Verbal and written Discharge instructions given to the patient. Wound care per Discharge AVS  Follow-up Information     Martavis Gurney, Dolores Lory, MD Follow up in 3 week(s).   Specialties: Vascular Surgery, Cardiology, Radiology, Vascular Surgery Why: with a carotid ultrasound Contact information: [place]  [id number] [phone]                 Signed: Hortencia Pilar, MD  10/26/2021, 3:39 PM

## 2021-10-26 NOTE — Progress Notes (Signed)
Pt was discharged from ICU, D/C instructions explained and pt verbalized understanding.  Pt wheeled down stairs via W/C.  All belongings with pt.  Both PIV removed and sites WNL.  Follow up appt made and instructions given to pt.

## 2021-10-26 NOTE — Progress Notes (Signed)
PHARMACIST - PHYSICIAN COMMUNICATION  CONCERNING: IV to Oral Route Change Policy  RECOMMENDATION: This patient is receiving pantoprazole by the intravenous route.  Based on criteria approved by the Pharmacy and Therapeutics Committee, the intravenous medication(s) is/are being converted to the equivalent oral dose form(s).   DESCRIPTION: These criteria include: The patient is eating (either orally or via tube) and/or has been taking other orally administered medications for a least 24 hours The patient has no evidence of active gastrointestinal bleeding or impaired GI absorption (gastrectomy, short bowel, patient on TNA or NPO).  If you have questions about this conversion, please contact the Pharmacy Department   Tressie Ellis, [facility] 10/26/2021 9:47 AM

## 2021-11-27 ENCOUNTER — Other Ambulatory Visit: Payer: Self-pay

## 2021-11-27 ENCOUNTER — Ambulatory Visit (INDEPENDENT_AMBULATORY_CARE_PROVIDER_SITE_OTHER): Payer: Medicaid Other | Admitting: Vascular Surgery

## 2021-11-27 ENCOUNTER — Ambulatory Visit (INDEPENDENT_AMBULATORY_CARE_PROVIDER_SITE_OTHER): Payer: Medicaid Other

## 2021-11-27 ENCOUNTER — Encounter (INDEPENDENT_AMBULATORY_CARE_PROVIDER_SITE_OTHER): Payer: Self-pay | Admitting: Vascular Surgery

## 2021-11-27 ENCOUNTER — Other Ambulatory Visit (INDEPENDENT_AMBULATORY_CARE_PROVIDER_SITE_OTHER): Payer: Self-pay | Admitting: Vascular Surgery

## 2021-11-27 VITALS — BP 189/86 | HR 81 | Resp 16 | Wt 151.4 lb

## 2021-11-27 DIAGNOSIS — Z959 Presence of cardiac and vascular implant and graft, unspecified: Secondary | ICD-10-CM

## 2021-11-27 DIAGNOSIS — I6521 Occlusion and stenosis of right carotid artery: Secondary | ICD-10-CM | POA: Diagnosis not present

## 2021-11-27 NOTE — Progress Notes (Signed)
? ? ?Patient ID: Danielle Briggs, female   DOB: [birth year], 61 y.o.   MRN: [id number] ? ?Chief Complaint  ?Patient presents with  ? Follow-up  ?  Ultrasound follow up ?  ? ? ?HPI ?Danielle Briggs is a 61 y.o. female.   ? ?Procedure 10/25/2021: ?Patient is s/p placement of a 9 x 7 x 40 exact stent with the use of the NAV-6 embolic protection device in the right internal carotid artery ? ?Patient presents today she has no c/o . ? ?Past Medical History:  ?Diagnosis Date  ? COPD (chronic obstructive pulmonary disease) ([facility])   ? Coronary artery disease   ? Hypertension   ? MI (myocardial infarction) ([facility])   ? ? ?Past Surgical History:  ?Procedure Laterality Date  ? APPENDECTOMY    ? CAROTID PTA/STENT INTERVENTION Right 10/25/2021  ? Procedure: CAROTID PTA/STENT INTERVENTION;  Surgeon: Renford Dills, MD;  Location: [facility] INVASIVE CV LAB;  Service: Cardiovascular;  Laterality: Right;  ? CHOLECYSTECTOMY N/A 09/01/2018  ? Procedure: LAPAROSCOPIC CHOLECYSTECTOMY-NO GRAMS;  Surgeon: Sung Amabile, DO;  Location: [facility] ORS;  Service: General;  Laterality: N/A;  ? RIGHT/LEFT HEART CATH AND CORONARY ANGIOGRAPHY Bilateral 09/28/2021  ? Procedure: RIGHT/LEFT HEART CATH AND CORONARY ANGIOGRAPHY;  Surgeon: Alwyn Pea, MD;  Location: [facility] INVASIVE CV LAB;  Service: Cardiovascular;  Laterality: Bilateral;  ? TUBAL LIGATION    ? ? ? ? ?Allergies  ?Allergen Reactions  ? Oxycodone Nausea Only  ? ? ?Current Outpatient Medications  ?Medication Sig Dispense Refill  ? amLODipine (NORVASC) 5 MG tablet Take 5 mg by mouth daily. for high blood pressure  5  ? aspirin EC 81 MG tablet Take 81 mg by mouth daily. Swallow whole.    ? clonazePAM (KLONOPIN) 1 MG tablet Take 0.5-1 mg by mouth 2 (two) times daily as needed.  0  ? clopidogrel (PLAVIX) 75 MG tablet Take 1 tablet (75 mg total) by mouth daily. 30 tablet 11  ? CVS VITAMIN B12 1000 MCG tablet Take 1,000 mcg by mouth daily.  11  ? escitalopram (LEXAPRO) 20 MG tablet Take 20 mg by mouth  daily.    ? ibuprofen (ADVIL) 800 MG tablet TAKE 1 TABLET BY MOUTH EVERY 8 HOURS AS NEEDED FOR WITH FOOD.    ? isosorbide mononitrate (IMDUR) 30 MG 24 hr tablet Take by mouth.    ? lisinopril (PRINIVIL,ZESTRIL) 20 MG tablet Take 20 mg by mouth daily. for high blood pressure  5  ? metoprolol tartrate (LOPRESSOR) 25 MG tablet Take 25 mg by mouth 2 (two) times daily.  5  ? nitroGLYCERIN (NITROSTAT) 0.4 MG SL tablet PLACE 1 TABLET UNDER THE TONGUE EVERY 5 MIN AS NEEDED FOR CHEST PAIN, (MAX 3 DOSES PER EPISODE)    ? Omega-3 Fatty Acids (ODORLESS COATED FISH OIL) 1000 MG CPDR Take 1,000 mg by mouth 2 (two) times daily.  5  ? PROAIR HFA 108 (90 Base) MCG/ACT inhaler SMARTSIG:2 Puff(s) By Mouth Every 4-6 Hours PRN    ? rOPINIRole (REQUIP) 0.25 MG tablet Take 1.5 mg by mouth at bedtime.   5  ? SPIRIVA HANDIHALER 18 MCG inhalation capsule Take 1 capsule by mouth daily.  5  ? zolpidem (AMBIEN) 5 MG tablet Take 5 mg by mouth at bedtime.    ? benzonatate (TESSALON) 100 MG capsule Take 100 mg by mouth 3 (three) times daily. (Patient not taking: Reported on 10/25/2021)    ? celecoxib (CELEBREX) 100 MG capsule Take 100 mg by mouth  2 (two) times daily as needed. (Patient not taking: Reported on 10/25/2021)    ? HYDROcodone-acetaminophen (NORCO/VICODIN) 5-325 MG tablet Take 1 tablet by mouth every 6 (six) hours as needed for moderate pain. (Patient not taking: Reported on 09/28/2021) 15 tablet 0  ? lamoTRIgine (LAMICTAL) 25 MG tablet Take 25 mg by mouth 2 (two) times daily. (Patient not taking: Reported on 09/28/2021)  0  ? nicotine (NICODERM CQ - DOSED IN MG/24 HOURS) 14 mg/24hr patch Place 1 patch (14 mg total) onto the skin daily. (Patient not taking: Reported on 04/03/2021) 28 patch 0  ? ondansetron (ZOFRAN) 4 MG tablet Take 1 tablet (4 mg total) by mouth every 8 (eight) hours as needed for nausea or vomiting. (Patient not taking: Reported on 09/28/2021) 20 tablet 0  ? ondansetron (ZOFRAN-ODT) 4 MG disintegrating tablet Take 1 tablet (4 mg  total) by mouth every 8 (eight) hours as needed. (Patient not taking: Reported on 09/28/2021) 20 tablet 0  ? ?No current facility-administered medications for this visit.  ? ? ? ? ? ? ?Physical Exam ?BP (!) 189/86 (BP Location: Right Arm)   Pulse 81   Resp 16   Wt 151 lb 6.4 oz (68.7 kg)   BMI 31.64 kg/m?  ?Gen:  WD/WN, NAD ?Skin: incision C/D/I; Neuro is intact ? ? ? ? ?Assessment/Plan: ?1. Carotid stenosis, symptomatic w/o infarct, right ?Recommend: ? ?The patient is s/p successful right carotid stent.  Duplex ultrasound of the carotid arteries today shows widely patent RICA stent and <40% LICA. ? ?Duplex ultrasound preoperatively shows <40% contralateral stenosis. ? ?Continue antiplatelet therapy as prescribed ?Continue management of CAD, HTN and Hyperlipidemia ?Healthy heart diet,  encouraged exercise at least 4 times per week ? ?Follow up in 3 months with duplex ultrasound and physical exam based on the patient's carotid surgery and <50% stenosis of the left carotid artery   ?- VAS US CAROTID; Future ? ? ? ? ? ?Danielle Briggs ?11/27/2021, 10:29 AM ? ? ?This note was created with Dragon medical transcription system.  Any errors from dictation are unintentional.   ?

## 2022-05-29 ENCOUNTER — Other Ambulatory Visit (INDEPENDENT_AMBULATORY_CARE_PROVIDER_SITE_OTHER): Payer: Self-pay | Admitting: Vascular Surgery

## 2022-05-29 DIAGNOSIS — I6523 Occlusion and stenosis of bilateral carotid arteries: Secondary | ICD-10-CM

## 2022-05-30 NOTE — Progress Notes (Deleted)
MRN : [id number]  Danielle Briggs is a 61 y.o. (1961-08-18) female who presents with chief complaint of check carotid arteries.  History of Present Illness:   The patient is seen for follow up evaluation of carotid stenosis status post right carotid stent on 10/25/2021.    Procedure 10/25/2021: Patient is s/p placement of a 9 x 7 x 40 exact stent with the use of the NAV-6 embolic protection device in the right internal carotid artery  There were no post operative problems or complications related to the surgery.  The patient denies neck or incisional pain.  The patient denies interval amaurosis fugax. There is no recent history of TIA symptoms or focal motor deficits. There is no prior documented CVA.  The patient denies headache.  The patient is taking enteric-coated aspirin 81 mg daily.  No recent shortening of the patient's walking distance or new symptoms consistent with claudication.  No history of rest pain symptoms. No new ulcers or wounds of the lower extremities have occurred.  There is no history of DVT, PE or superficial thrombophlebitis. No recent episodes of angina or shortness of breath documented.    No outpatient medications have been marked as taking for the 05/31/22 encounter (Appointment) with Gilda Crease, Latina Craver, MD.    Past Medical History:  Diagnosis Date   COPD (chronic obstructive pulmonary disease) ([facility])    Coronary artery disease    Hypertension    MI (myocardial infarction) [facility])     Past Surgical History:  Procedure Laterality Date   APPENDECTOMY     CAROTID PTA/STENT INTERVENTION Right 10/25/2021   Procedure: CAROTID PTA/STENT INTERVENTION;  Surgeon: Renford Dills, MD;  Location: [facility] INVASIVE CV LAB;  Service: Cardiovascular;  Laterality: Right;   CHOLECYSTECTOMY N/A 09/01/2018   Procedure: LAPAROSCOPIC CHOLECYSTECTOMY-NO GRAMS;  Surgeon: Sung Amabile, DO;  Location: [facility] ORS;  Service: General;  Laterality: N/A;   RIGHT/LEFT HEART CATH AND  CORONARY ANGIOGRAPHY Bilateral 09/28/2021   Procedure: RIGHT/LEFT HEART CATH AND CORONARY ANGIOGRAPHY;  Surgeon: Alwyn Pea, MD;  Location: [facility] INVAS[facility] CV LAB;  Service: Cardiovascular;  Laterality: Bilateral;   TUBAL LIGATION      Social History Social History   Tobacco Use   Smoking status: Former    Packs/day: 2.00    Years: 40.00    Total pack years: 80.00    Types: Cigarettes    Quit date: 2013    Years since quitting: 10.6   Smokeless tobacco: Never  Substance Use Topics   Alcohol use: Not Currently   Drug use: Yes    Types: Marijuana    Comment: 3 days ago    Family History No family history on file.  Allergies  Allergen Reactions   Oxycodone Nausea Only     REVIEW OF SYSTEMS (Negative unless checked)  Constitutional: [] Weight loss  [] Fever  [] Chills Cardiac: [] Chest pain   [] Chest pressure   [] Palpitations   [] Shortness of breath when laying flat   [] Shortness of breath with exertion. Vascular:  [x] Pain in legs with walking   [] Pain in legs at rest  [] History of DVT   [] Phlebitis   [] Swelling in legs   [] Varicose veins   [] Non-healing ulcers Pulmonary:   [] Uses home oxygen   [] Productive cough   [] Hemoptysis   [] Wheeze  [] COPD   [] Asthma Neurologic:  [] Dizziness   [] Seizures   [] History of stroke   [] History of TIA  [] Aphasia   [] Vissual changes   [] Weakness or numbness in  arm   [] Weakness or numbness in leg Musculoskeletal:   [] Joint swelling   [] Joint pain   [] Low back pain Hematologic:  [] Easy bruising  [] Easy bleeding   [] Hypercoagulable state   [] Anemic Gastrointestinal:  [] Diarrhea   [] Vomiting  [] Gastroesophageal reflux/heartburn   [] Difficulty swallowing. Genitourinary:  [] Chronic kidney disease   [] Difficult urination  [] Frequent urination   [] Blood in urine Skin:  [] Rashes   [] Ulcers  Psychological:  [] History of anxiety   []  History of major depression.  Physical Examination  There were no vitals filed for this visit. There is no height or  weight on file to calculate BMI. Gen: WD/WN, NAD Head: [place]/AT, No temporalis wasting.  Ear/Nose/Throat: Hearing grossly intact, nares w/o erythema or drainage Eyes: PER, EOMI, sclera nonicteric.  Neck: Supple, no masses.  No bruit or JVD.  Pulmonary:  Good air movement, no audible wheezing, no use of accessory muscles.  Cardiac: RRR, normal S1, S2, no Murmurs. Vascular:  carotid bruit noted Vessel Right Left  Radial Palpable Palpable  Carotid  Palpable  Palpable  Subclav  Palpable Palpable  Gastrointestinal: soft, non-distended. No guarding/no peritoneal signs.  Musculoskeletal: M/S 5/5 throughout.  No visible deformity.  Neurologic: CN 2-12 intact. Pain and light touch intact in extremities.  Symmetrical.  Speech is fluent. Motor exam as listed above. Psychiatric: Judgment intact, Mood & affect appropriate for pt's clinical situation. Dermatologic: No rashes or ulcers noted.  No changes consistent with cellulitis.   CBC Lab Results  Component Value Date   WBC 7.5 10/26/2021   HGB 12.8 10/26/2021   HCT 38.8 10/26/2021   MCV 100.3 (H) 10/26/2021   PLT 178 10/26/2021    BMET    Component Value Date/Time   NA 136 10/26/2021 0445   K 3.8 10/26/2021 0445   CL 111 10/26/2021 0445   CO2 21 (L) 10/26/2021 0445   GLUCOSE 150 (H) 10/26/2021 0445   BUN 8 10/26/2021 0445   CREATININE 0.76 10/26/2021 0445   CALCIUM 8.3 (L) 10/26/2021 0445   GFRNONAA >60 10/26/2021 0445   GFRAA >60 09/02/2018 0402   CrCl cannot be calculated (Patient's most recent lab result is older than the maximum 21 days allowed.).  COAG No results found for: "INR", "PROTIME"  Radiology No results found.   Assessment/Plan There are no diagnoses linked to this encounter.   , MD  05/30/2022 12:21 PM

## 2022-05-31 ENCOUNTER — Encounter (INDEPENDENT_AMBULATORY_CARE_PROVIDER_SITE_OTHER): Payer: Medicaid Other

## 2022-05-31 ENCOUNTER — Ambulatory Visit (INDEPENDENT_AMBULATORY_CARE_PROVIDER_SITE_OTHER): Payer: Medicaid Other | Admitting: Vascular Surgery

## 2022-07-21 ENCOUNTER — Emergency Department
Admission: EM | Admit: 2022-07-21 | Discharge: 2022-07-25 | Disposition: E | Payer: Medicaid Other | Attending: Emergency Medicine | Admitting: Emergency Medicine

## 2022-07-21 DIAGNOSIS — Z955 Presence of coronary angioplasty implant and graft: Secondary | ICD-10-CM | POA: Insufficient documentation

## 2022-07-21 DIAGNOSIS — I2510 Atherosclerotic heart disease of native coronary artery without angina pectoris: Secondary | ICD-10-CM | POA: Insufficient documentation

## 2022-07-21 DIAGNOSIS — Z7951 Long term (current) use of inhaled steroids: Secondary | ICD-10-CM | POA: Diagnosis not present

## 2022-07-21 DIAGNOSIS — I10 Essential (primary) hypertension: Secondary | ICD-10-CM | POA: Diagnosis not present

## 2022-07-21 DIAGNOSIS — Z79899 Other long term (current) drug therapy: Secondary | ICD-10-CM | POA: Insufficient documentation

## 2022-07-21 DIAGNOSIS — J449 Chronic obstructive pulmonary disease, unspecified: Secondary | ICD-10-CM | POA: Diagnosis not present

## 2022-07-21 DIAGNOSIS — I469 Cardiac arrest, cause unspecified: Secondary | ICD-10-CM | POA: Diagnosis present

## 2022-07-21 DIAGNOSIS — Z7982 Long term (current) use of aspirin: Secondary | ICD-10-CM | POA: Insufficient documentation

## 2022-07-25 LAB — CBG MONITORING, ED: Glucose-Capillary: 371 mg/dL — ABNORMAL HIGH (ref 70–99)

## 2022-07-25 NOTE — ED Notes (Addendum)
Code is as follows: 0006: Pt arrives with LUCAS In operation first pulse check results PEA and compressions resumed. 0007:1st epi given 0009: Bicarb given 0011: Pulse check,PEA, resume compressions 0011:2nd Epi 0013: Minimal  femoral pulse palpated. At this time MD attempted to visualized Cardiac movement with ultrasound but minimal cardiac movement detected. Cpr resumed @0017  0019:Pulse check,PEA, resume compressions 0019: 3rd Epi 0020: Bicarb given 0021:Pulse check,PEA, resume compressions 0023: Pulse check,PEA, resume compressions 0023:4th Epi 0024:Bicarb given 0025:Minimal  femoral pulse palpated. At this time MD again attempted to visualized Cardiac movement with ultrasound but again minimal cardiac movement detected. CPR resumed 0027:Pulse check,PEA, resume compressions 0027:5th Epi 0029:Pulse check,PEA, resume compressions 0031:Pulse check,PEA, resume compressions 0034: Time of death called.

## 2022-07-25 NOTE — ED Provider Notes (Addendum)
[facility] Provider Note    Event Date/Time   First MD Initiated Contact with Patient 2022/08/11 0003     (approximate)   History   Cardiac Arrest   HPI  Danielle Briggs is a 61 y.o. female history of coronary artery disease, aortic valve stenosis status post TAVR on 06/05/2022 at [facility], COPD, hypertension, tobacco use who presents to the emergency department with EMS in cardiac arrest.  Per EMS, patient had been complaining of chest pain all day.  Refused to come to the hospital.  Took 2 nitroglycerin tablets at home.  Witnessed arrest by her friend Mr. Janee Morn and medic 1 who initiated CPR immediately at 2310.  Patient has remained in PEA.  No shocks delivered.  Received a total of 3 rounds of epinephrine with EMS, 1 amp of bicarb, 1 g of calcium.  Blood sugar was in the 470s with EMS.  Patient has IO in the left tibia and supraglottic airway in place.   History provided by EMS, family.  Level 5 caveat secondary to cardiac arrest.    Past Medical History:  Diagnosis Date   COPD (chronic obstructive pulmonary disease) (HCC)    Coronary artery disease    Hypertension    MI (myocardial infarction) [facility])     Past Surgical History:  Procedure Laterality Date   APPENDECTOMY     CAROTID PTA/STENT INTERVENTION Right 10/25/2021   Procedure: CAROTID PTA/STENT INTERVENTION;  Surgeon: Renford Dills, MD;  Location: [facility] INVASIVE CV LAB;  Service: Cardiovascular;  Laterality: Right;   CHOLECYSTECTOMY N/A 09/01/2018   Procedure: LAPAROSCOPIC CHOLECYSTECTOMY-NO GRAMS;  Surgeon: Sung Amabile, DO;  Location: [facility] ORS;  Service: General;  Laterality: N/A;   RIGHT/LEFT HEART CATH AND CORONARY ANGIOGRAPHY Bilateral 09/28/2021   Procedure: RIGHT/LEFT HEART CATH AND CORONARY ANGIOGRAPHY;  Surgeon: Alwyn Pea, MD;  Location: [facility] INVASIVE CV LAB;  Service: Cardiovascular;  Laterality: Bilateral;   TUBAL LIGATION      MEDICATIONS:  Prior to Admission medications    Medication Sig Start Date End Date Taking? Authorizing Provider  amLODipine (NORVASC) 5 MG tablet Take 5 mg by mouth daily. for high blood pressure 08/26/18   [provider]  aspirin EC 81 MG tablet Take 81 mg by mouth daily. Swallow whole.    [provider]  benzonatate (TESSALON) 100 MG capsule Take 100 mg by mouth 3 (three) times daily. Patient not taking: Reported on 10/25/2021 07/29/21   [provider]  celecoxib (CELEBREX) 100 MG capsule Take 100 mg by mouth 2 (two) times daily as needed. Patient not taking: Reported on 10/25/2021 10/02/21   [provider]  clonazePAM (KLONOPIN) 1 MG tablet Take 0.5-1 mg by mouth 2 (two) times daily as needed. 08/26/18   [provider]  clopidogrel (PLAVIX) 75 MG tablet Take 1 tablet (75 mg total) by mouth daily. 10/26/21   Schnier, Latina Craver, MD  [employer] VITAMIN B12 1000 MCG tablet Take 1,000 mcg by mouth daily. 08/26/18   [provider]  escitalopram (LEXAPRO) 20 MG tablet Take 20 mg by mouth daily. 01/24/21   [provider]  HYDROcodone-acetaminophen (NORCO/VICODIN) 5-325 MG tablet Take 1 tablet by mouth every 6 (six) hours as needed for moderate pain. Patient not taking: Reported on 09/28/2021 07/18/21   Felecia Shelling, DPM  ibuprofen (ADVIL) 800 MG tablet TAKE 1 TABLET BY MOUTH EVERY 8 HOURS AS NEEDED FOR WITH FOOD. 06/16/21   [provider]  isosorbide mononitrate (IMDUR) 30 MG  24 hr tablet Take by mouth. 08/16/21 08/16/22  [provider]  lamoTRIgine (LAMICTAL) 25 MG tablet Take 25 mg by mouth 2 (two) times daily. Patient not taking: Reported on 09/28/2021 07/21/18   [provider]  lisinopril (PRINIVIL,ZESTRIL) 20 MG tablet Take 20 mg by mouth daily. for high blood pressure 08/22/18   [provider]  metoprolol tartrate (LOPRESSOR) 25 MG tablet Take 25 mg by mouth 2 (two) times daily. 08/22/18   [provider]  nicotine (NICODERM CQ - DOSED IN MG/24  HOURS) 14 mg/24hr patch Place 1 patch (14 mg total) onto the skin daily. Patient not taking: Reported on 04/03/2021 09/03/18   Adrian Saran, MD  nitroGLYCERIN (NITROSTAT) 0.4 MG SL tablet PLACE 1 TABLET UNDER THE TONGUE EVERY 5 MIN AS NEEDED FOR CHEST PAIN, (MAX 3 DOSES PER EPISODE) 02/02/15   [provider]  Omega-3 Fatty Acids (ODORLESS COATED FISH OIL) 1000 MG CPDR Take 1,000 mg by mouth 2 (two) times daily. 08/26/18   [provider]  ondansetron (ZOFRAN) 4 MG tablet Take 1 tablet (4 mg total) by mouth every 8 (eight) hours as needed for nausea or vomiting. Patient not taking: Reported on 09/28/2021 07/18/21   Felecia Shelling, DPM  ondansetron (ZOFRAN-ODT) 4 MG disintegrating tablet Take 1 tablet (4 mg total) by mouth every 8 (eight) hours as needed. Patient not taking: Reported on 09/28/2021 04/16/21   Faythe Ghee, PA-C  PROAIR HFA 108 [phone] Base) MCG/ACT inhaler SMARTSIG:2 Puff(s) By Mouth Every 4-6 Hours PRN 12/26/20   [provider]  rOPINIRole (REQUIP) 0.25 MG tablet Take 1.5 mg by mouth at bedtime.  08/22/18   [provider]  SPIRIVA HANDIHALER 18 MCG inhalation capsule Take 1 capsule by mouth daily. 06/28/18   [provider]  zolpidem (AMBIEN) 5 MG tablet Take 5 mg by mouth at bedtime. 03/28/21   [provider]    Physical Exam   Triage Vital Signs: ED Triage Vitals  Enc Vitals Group     BP      Pulse      Resp      Temp      Temp src      SpO2      Weight      Height      Head Circumference      Peak Flow      Pain Score      Pain Loc      Pain Edu?      Excl. in GC?     Most recent vital signs: There were no vitals filed for this visit.  CONSTITUTIONAL: GCS 3 HEAD: Normocephalic, atraumatic EYES: Pupils 6 mm and nonreactive, no corneal reflex ENT: normal nose; supraglottic airway in place with dark red blood coming from around the device NECK: Trachea midline CARD: Pulseless in PEA RESP: Patient receiving assisted  ventilations through i-gel, diminished aeration but no rhonchi or wheezing ABD/GI: Nondistended EXT: Extremities cool to touch.  Left tibial IO placed by EMS.  No edema. SKIN: Pale, cool to touch NEURO: GCS 3    ED Results / Procedures / Treatments   LABS: (all labs ordered are listed, but only abnormal results are displayed) Labs Reviewed - No data to display   EKG:   RADIOLOGY: My personal review and interpretation of imaging:    I have personally reviewed all radiology reports.   No results found.   PROCEDURES:  Critical Care performed: Yes, see critical care procedure  note(s)   CRITICAL CARE Performed by: Pryor Curia   Total critical care time: 30 minutes  Critical care time was exclusive of separately billable procedures and treating other patients.  Critical care was necessary to treat or prevent imminent or life-threatening deterioration.  Critical care was time spent personally by me on the following activities: development of treatment plan with patient and/or surrogate as well as nursing, discussions with consultants, evaluation of patient's response to treatment, examination of patient, obtaining history from patient or surrogate, ordering and performing treatments and interventions, ordering and review of laboratory studies, ordering and review of radiographic studies, pulse oximetry and re-evaluation of patient's condition.   Cardiopulmonary Resuscitation (CPR) Procedure Note Directed/Performed by: Pryor Curia I personally directed ancillary staff and/or performed CPR in an effort to regain return of spontaneous circulation and to maintain cardiac, neuro and systemic perfusion.   INTUBATION Performed by: Pryor Curia  Required items: required blood products, implants, devices, and special equipment available Patient identity confirmed: provided demographic data and hospital-assigned identification number Time out: Immediately prior to procedure a  "time out" was called to verify the correct patient, procedure, equipment, support staff and site/side marked as required.  Indications: Cardiac and respiratory arrest  Intubation method: Glidescope Laryngoscopy   Preoxygenation: iGel  Sedatives: None Paralytic: None  Tube Size: 7.5 cuffed  Post-procedure assessment: chest rise and ETCO2 monitor Breath sounds: equal and absent over the epigastrium Tube secured with: ETT holder   Patient tolerated the procedure well with no immediate complications.    Procedures    IMPRESSION / MDM / ASSESSMENT AND PLAN / ED COURSE  I reviewed the triage vital signs and the nursing notes.    Patient here with cardiac and respiratory arrest after complaining of chest pain to family all day.  The patient is on the cardiac monitor to evaluate for evidence of arrhythmia and/or significant heart rate changes.   DIFFERENTIAL DIAGNOSIS (includes but not limited to):   Cardiac arrest, respiratory arrest, possible MI, PE, dissection.  Bedside ultrasound shows no pericardial effusion.   Patient's presentation is most consistent with acute presentation with potential threat to life or bodily function.   PLAN: Patient here in respiratory and cardiac arrest.  Blood sugar here in the 370s.  Patient continues to be in PEA and we were never able to get a pulse back.  GCS 3 and no signs of life.  iGel changed out to ETT.  Patient received resuscitation for 1 hour and 24 minutes.  Bedside ultrasound showed no pericardial effusion and only very minimal cardiac movement that would not be a perfusable rhythm for less than 60 seconds after the last epi was given and then cardiac standstill.  Time of death was called at 12:34 AM.   [place] ED: Medications - No data to display   ED COURSE: Patient's son updated with chaplain present.   CONSULTS: 2:20 AM  Spoke with Philbert Riser, medical examiner in [place].  Patient is not ME case.    PCP on last  admit note at [facility] Sept [facility].   OUTSIDE RECORDS REVIEWED: Reviewed patient's last thoracic surgery note 06/29/2022.       FINAL CLINICAL IMPRESSION(S) / ED DIAGNOSES   Final diagnoses:  Cardiac arrest ([place])     Rx / DC Orders   ED Discharge Orders     None        Note:  This document was prepared using Dragon voice recognition software  and may include unintentional dictation errors.   Syniah Berne, Layla Maw, DO 2022-07-25 0110    Elma Limas, Layla Maw, DO 07/25/22 [phone]

## 2022-07-25 NOTE — Progress Notes (Signed)
Chart accessed to provide information to Dr. Ocie Doyne [phone] for completion of death certificate on [birth year]

## 2022-07-25 NOTE — Progress Notes (Addendum)
   2022-07-23 0100  Clinical Encounter Type  Visited With Patient and family together  Visit Type Initial  Referral From Nurse  Consult/Referral To Chaplain   Chaplain responded to CPR in progress page. Chaplain witnessed care team provide CPR. Patient was not able to recover and passed. Chaplain met with Royanne Foots (son and his SO), Susann Givens (sister), and Monica Martinez (patient's SO). Monica Martinez shared when he got home patient was on the floor. Chaplain provided compassionate presence and reflective listening as family spoke about patient's health challenges. Chaplain supported family through saying goodbye to patient and meeting as a family to discuss next steps. Chaplain is available for follow up as needed.

## 2022-07-25 NOTE — ED Notes (Signed)
Pt from home to [facility] via [place] from witnessed cardiac arrest by husband. EMS stated that they had performed CPR for one hour and had given her 3rds of EPI 1rd of calcium and one round of bicarb prior to arrival. Pt's husband stated that she had been complaining of L arm pain all day and had taken 2 nitro tablets during the day.

## 2022-07-25 NOTE — ED Notes (Signed)
Notified [employer] @ [address] of Pt's deceased status. Pt is a tentative candidate for eye and tissue donation. Pt's honor Conservation officer, nature # is [id number].

## 2022-07-25 DEATH — deceased

## 2023-10-04 IMAGING — CT CT ANGIO NECK
3 of 7 series · 10 of 35 positions shown · IV contrast (omnipaque)
Comparison: Carotid Doppler ultrasound 03/13/2021

CLINICAL DATA: Carotid artery stenosis. Dizziness and visual
disturbances.

EXAM:
CT ANGIOGRAPHY NECK
TECHNIQUE: Multidetector CT imaging of the neck was performed using the
standard protocol during bolus administration of intravenous
contrast. Multiplanar CT image reconstructions and MIPs were
obtained to evaluate the vascular anatomy. Carotid stenosis
measurements (when applicable) are obtained utilizing NASCET
criteria, using the distal internal carotid diameter as the
denominator.
CONTRAST:  75mL OMNIPAQUE IOHEXOL 350 MG/ML SOLN

[Series 6: cta neck cta neck (person_name) 2.00 · axial · 0.44mm/px · z∈[-675,-605]mm · 2 of 105 slices shown]
[im 35/105  soft-tissue]
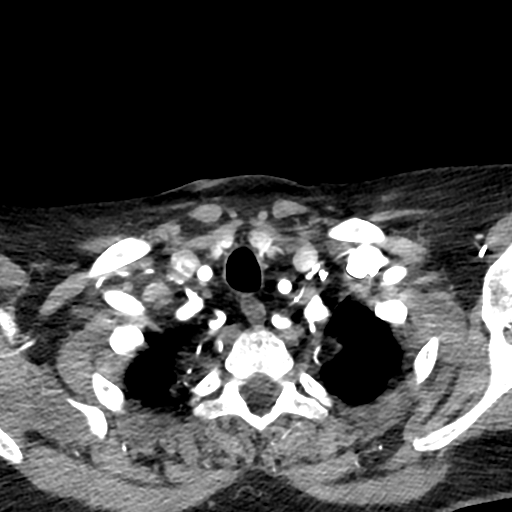
[im 70/105  soft-tissue]
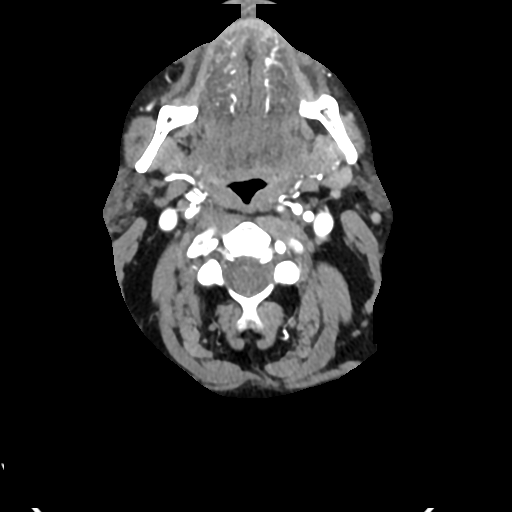

[Series 7: ax thin mips cta neck 1.00 ax · axial · 0.35mm/px · z∈[-732,-578]mm · 5 of 235 slices shown]
[im 40/235  soft-tissue]
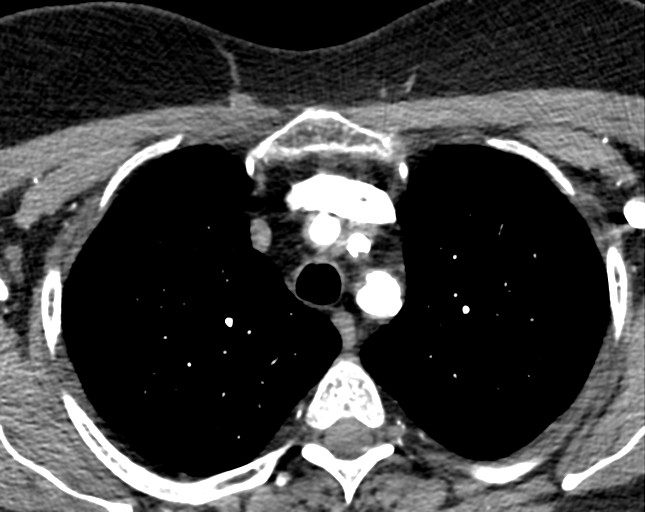
[im 79/235  bone]
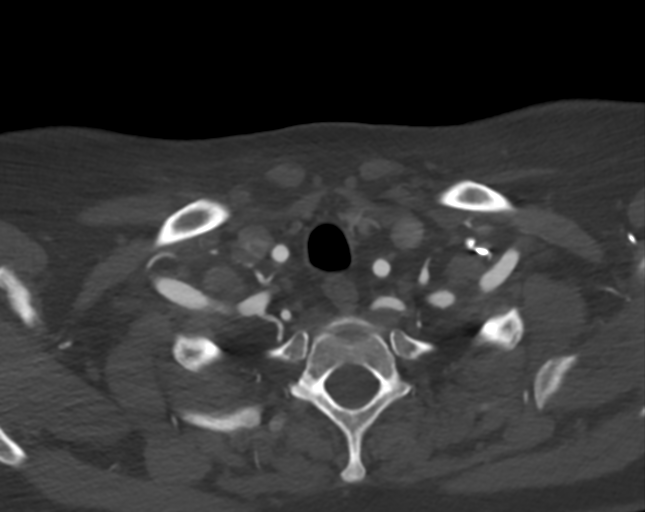
[im 118/235  soft-tissue]
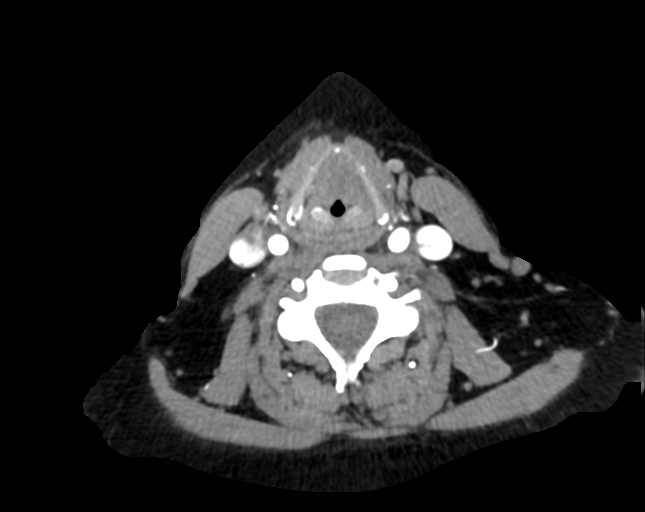
[im 157/235  bone]
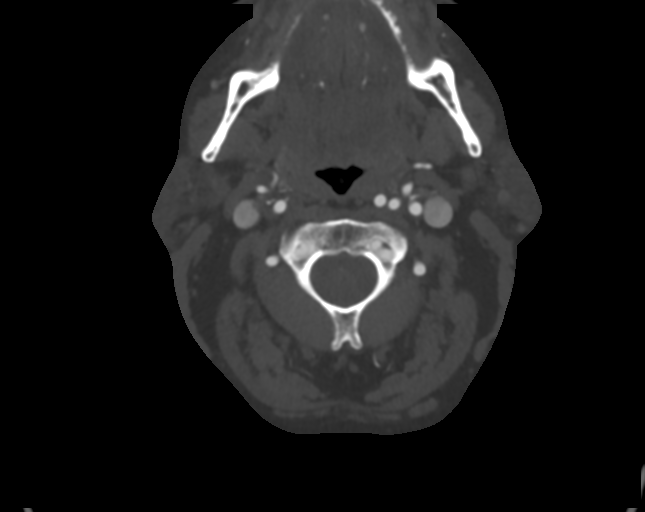
[im 196/235  soft-tissue]
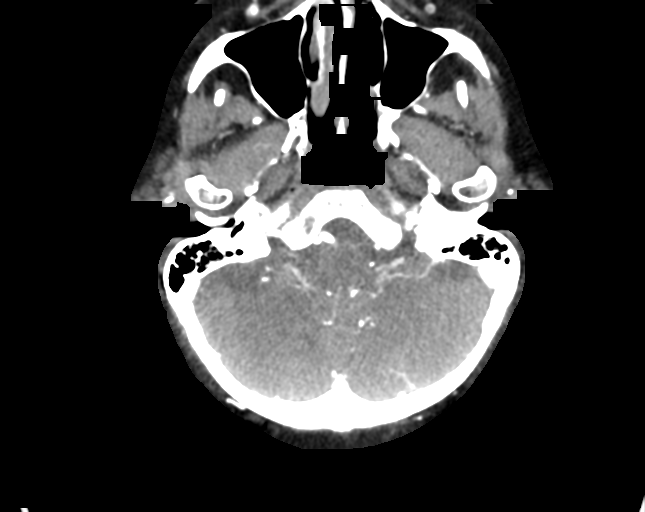

[Series 11: sag thin mips cta neck 1.00 sag · sagittal · 0.35mm/px · 3 of 225 slices shown]
[im 14/225  soft-tissue]
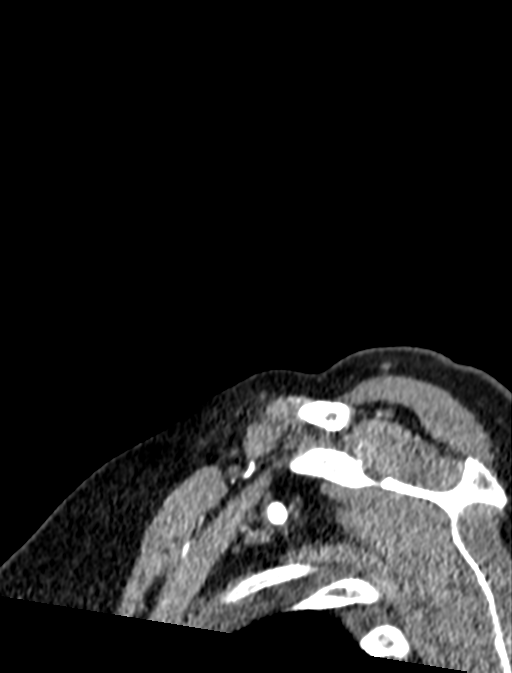
[im 113/225  soft-tissue]
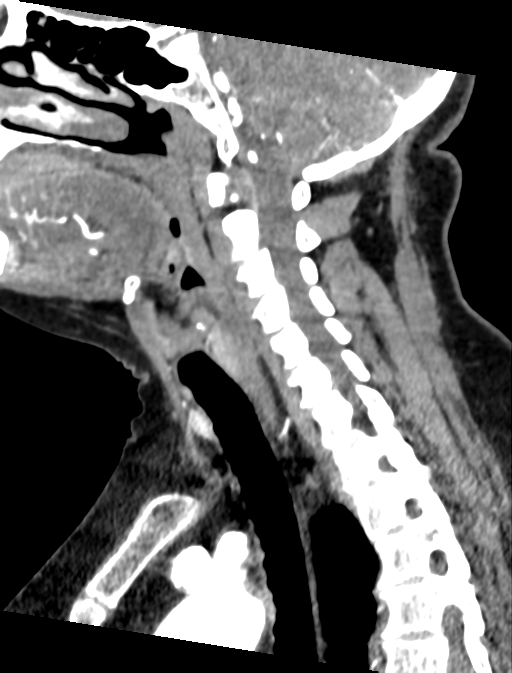
[im 212/225  soft-tissue]
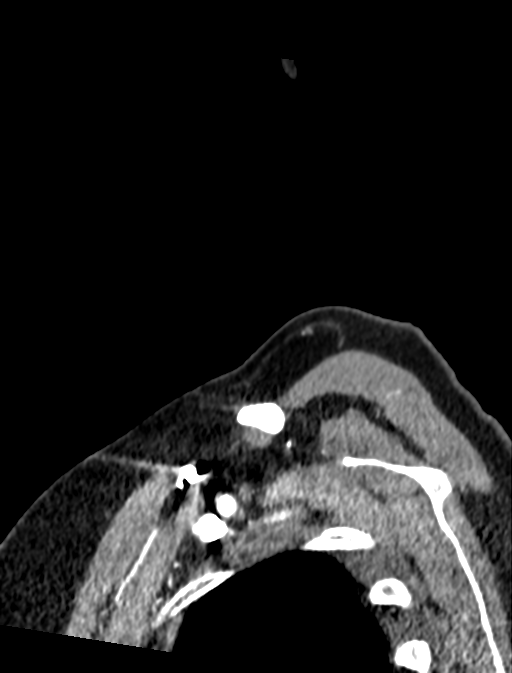

[10 of 35 positions shown; findings below may reference images not displayed]

FINDINGS: Aortic arch: Standard 3 vessel aortic arch with mild atherosclerotic
plaque. Atherosclerotic plaque in the brachiocephalic and subclavian
arteries without flow limiting stenosis.

Right carotid system: Patent with predominantly soft plaque in the
carotid bulb resulting in approximately 80% ICA stenosis at the
distal aspect of the bulb. Tortuous distal cervical ICA.

Left carotid system: Patent with mixed calcified and soft plaque in
the proximal common carotid artery resulting in 70% stenosis. Soft
and calcified plaque in the proximal ICA resulting in luminal
irregularity but no stenosis of 50% or greater. Tortuous mid
cervical ICA.

Vertebral arteries: The vertebral arteries are patent without
evidence of a significant stenosis on the right. There is a small
curvilinear filling defect in the left subclavian artery at the
origin of the left vertebral artery which could reflect a focal
dissection flap, and this results in severe stenosis of the left
vertebral origin estimated at 85% or greater. The left vertebral
artery is otherwise widely patent throughout the remainder of the
neck and is mildly dominant.

Skeleton: Asymmetrically advanced left-sided facet arthrosis at C3-4
and C4-5.

Other neck: No evidence of cervical lymphadenopathy or mass.

Upper chest: Mild centrilobular emphysema.
IMPRESSION: 1. 80% proximal right ICA stenosis.
2. 70% proximal left common carotid artery stenosis.
3. Possible focal dissection at the origin of the left vertebral
artery with severe left vertebral origin stenosis.
4. Aortic Atherosclerosis (18CF4-FU9.9) and Emphysema (18CF4-L2B.H).
